# Patient Record
Sex: Male | Born: 1960 | Race: White | Hispanic: No | Marital: Single | Smoking: Former smoker
Health system: Southern US, Community
[De-identification: ages and names within clinical notes are randomized; demographics above are authoritative.]

## PROBLEM LIST (undated history)

## (undated) DIAGNOSIS — G20 Parkinson's disease: Principal | ICD-10-CM

## (undated) DIAGNOSIS — B029 Zoster without complications: Secondary | ICD-10-CM

## (undated) HISTORY — DX: Parkinson's disease: G20

## (undated) HISTORY — PX: ROTATOR CUFF REPAIR: SHX139

## (undated) HISTORY — PX: ELBOW SURGERY: SHX618

## (undated) HISTORY — DX: Zoster without complications: B02.9

---

## 2011-12-08 ENCOUNTER — Ambulatory Visit (INDEPENDENT_AMBULATORY_CARE_PROVIDER_SITE_OTHER): Payer: BC Managed Care – PPO | Admitting: Emergency Medicine

## 2011-12-08 VITALS — BP 122/64 | HR 72 | Temp 97.6°F | Resp 16 | Ht 71.0 in | Wt 210.8 lb

## 2011-12-08 DIAGNOSIS — B86 Scabies: Secondary | ICD-10-CM

## 2011-12-08 MED ORDER — PERMETHRIN 5 % EX CREA: TOPICAL_CREAM | Freq: Once | CUTANEOUS | Status: AC

## 2011-12-08 NOTE — Patient Instructions (Addendum)
Scabies Scabies are small bugs (mites) that burrow under the skin and cause red bumps and severe itching. These bugs can only be seen with a microscope. Scabies are highly contagious. They can spread easily from person to person by direct contact. They are also spread through sharing clothing or linens that have the scabies mites living in them. It is not unusual for an entire family to become infected through shared towels, clothing, or bedding.  HOME CARE INSTRUCTIONS   Your caregiver may prescribe a cream or lotion to kill the mites. If this cream is prescribed; massage the cream into the entire area of the body from the neck to the bottom of both feet. Also massage the cream into the scalp and face if your child is less than 1 year old. Avoid the eyes and mouth.   Leave the cream on for 8 to12 hours. Do not wash your hands after application. Your child should bathe or shower after the 8 to 12 hour application period. Sometimes it is helpful to apply the cream to your child at right before bedtime.   One treatment is usually effective and will eliminate approximately 95% of infestations. For severe cases, your caregiver may decide to repeat the treatment in 1 week. Everyone in your household should be treated with one application of the cream.   New rashes or burrows should not appear after successful treatment within 24 to 48 hours; however the itching and rash may last for 2 to 4 weeks after successful treatment. If your symptoms persist longer than this, see your caregiver.   Your caregiver also may prescribe a medication to help with the itching or to help the rash go away more quickly.   Scabies can live on clothing or linens for up to 3 days. Your entire child's recently used clothing, towels, stuffed toys, and bed linens should be washed in hot water and then dried in a dryer for at least 20 minutes on high heat. Items that cannot be washed should be enclosed in a plastic bag for at least 3  days.   To help relieve itching, bathe your child in a cool bath or apply cool washcloths to the affected areas.   Your child may return to school after treatment with the prescribed cream.  SEEK MEDICAL CARE IF:   The itching persists longer than 4 weeks after treatment.   The rash spreads or becomes infected (the area has red blisters or yellow-tan crust).  Document Released: 05/22/2005 Document Revised: 05/11/2011 Document Reviewed: 09/30/2008 [employer] Patient Information 2012 [employer]. 

## 2011-12-08 NOTE — Progress Notes (Deleted)
  Subjective:    Patient ID: Jesse Robinson, male    DOB: 1961/01/24, 51 y.o.   MRN: [id number]  HPI    Review of Systems     Objective:   Physical Exam        Assessment & Plan:

## 2011-12-08 NOTE — Progress Notes (Signed)
   Patient Name: Jesse Robinson Date of Birth: 04-02-1961 Medical Record Number: [id number] Gender: male Date of Encounter: 12/08/2011  Chief Complaint: Rash   History of Present Illness:  Jesse Robinson is a 51 y.o. very pleasant male patient who presents with the following:  Exposed to scabies through contact with girlfriend.  Not symptomatic   There is no problem list on file for this patient.  No past medical history on file. No past surgical history on file. History  Substance Use Topics  . Smoking status: Never Smoker   . Smokeless tobacco: Not on file  . Alcohol Use: Not on file   No family history on file. No Known Allergies  Medication list has been reviewed and updated.  Current Outpatient Prescriptions on File Prior to Visit  Medication Sig Dispense Refill  . tadalafil (CIALIS) 5 MG tablet Take 5 mg by mouth daily as needed.        Review of Systems: As per HPI, otherwise negative.    Physical Examination: Filed Vitals:   12/08/11 0810  BP: 122/64  Pulse: 72  Temp: 97.6 F (36.4 C)  Resp: 16   Filed Vitals:   12/08/11 0810  Height: 5\' 11"  (1.803 m)  Weight: 210 lb 12.8 oz (95.618 kg)   Body mass index is 29.40 kg/(m^2). Ideal Body Weight: Weight in (lb) to have BMI = 25: 178.9    GEN: WDWN, NAD, Non-toxic, Alert & Oriented x 3 HEENT: Atraumatic, Normocephalic.  Ears and Nose: No external deformity. EXTR: No clubbing/cyanosis/edema NEURO: Normal gait.  PSYCH: Normally interactive. Conversant. Not depressed or anxious appearing.  Calm demeanor.    EKG / Labs / Xrays: None available at time of encounter  Assessment and Plan: scabies  Carmelina Dane, MD

## 2011-12-18 ENCOUNTER — Ambulatory Visit: Payer: BC Managed Care – PPO

## 2012-06-05 ENCOUNTER — Ambulatory Visit (INDEPENDENT_AMBULATORY_CARE_PROVIDER_SITE_OTHER): Payer: BC Managed Care – PPO | Admitting: Internal Medicine

## 2012-06-05 VITALS — BP 118/82 | HR 80 | Temp 97.6°F | Resp 18 | Wt 213.0 lb

## 2012-06-05 DIAGNOSIS — B029 Zoster without complications: Secondary | ICD-10-CM

## 2012-06-05 MED ORDER — VALACYCLOVIR HCL 1 G PO TABS: 1000.0000 mg | ORAL_TABLET | Freq: Two times a day (BID) | ORAL | Status: DC

## 2012-06-05 NOTE — Progress Notes (Signed)
  Subjective:    Patient ID: Jesse Robinson, male    DOB: February 11, 1961, 52 y.o.   MRN: [id number]  HPI  Patient with left thigh/groin rash for 5 days/hypersensitive to touch No fever or chills Review of Systems     Objective:   Physical Exam  Erythematous papulovesicular lesions in groups around left groin and left lateral hip  with sensitivity even in the normal skin areas of that dermatome     Assessment & Plan:  Problem #1 shingles Meds ordered this encounter  Medications  . valACYclovir (VALTREX) 1000 MG tablet    Sig: Take 1 tablet (1,000 mg total) by mouth 2 (two) times daily.    Dispense:  20 tablet    Refill:  0   Advised will still need to consider the shingles vaccine after recovery.  Follow if not well in 2-3 weeks or if increased pain

## 2012-06-05 NOTE — Patient Instructions (Addendum)

## 2012-10-21 ENCOUNTER — Encounter: Payer: Self-pay | Admitting: Neurology

## 2012-10-21 ENCOUNTER — Telehealth: Payer: Self-pay | Admitting: Neurology

## 2012-10-21 ENCOUNTER — Ambulatory Visit (INDEPENDENT_AMBULATORY_CARE_PROVIDER_SITE_OTHER): Payer: BC Managed Care – PPO | Admitting: Neurology

## 2012-10-21 VITALS — BP 134/84 | HR 76 | Temp 97.8°F | Ht 71.0 in | Wt 216.0 lb

## 2012-10-21 DIAGNOSIS — G20 Parkinson's disease: Secondary | ICD-10-CM

## 2012-10-21 MED ORDER — RASAGILINE MESYLATE 1 MG PO TABS: 1.0000 mg | ORAL_TABLET | Freq: Every day | ORAL | Status: DC

## 2012-10-21 MED ORDER — RASAGILINE MESYLATE 0.5 MG PO TABS: 0.5000 mg | ORAL_TABLET | Freq: Every day | ORAL | Status: DC

## 2012-10-21 NOTE — Telephone Encounter (Signed)
Patient was taking 0.5mg  daily for 14 days, then was asked to increase dose to 1mg  daily.  I will send new Rx for 1mg , as the insurance will not pay for two 0.5mg  tablets.

## 2012-10-21 NOTE — Patient Instructions (Addendum)
I do have some generic suggestions for you today:   Please make sure that you drink plenty of fluids. I would like for you to exercise daily for example in the form of walking 20-30 minutes every day, if you can. Please keep a regular sleep-wake schedule, keep regular meal times, do not skip any meals, eat  healthy snacks in between meals, such as fruit or nuts. Try to eat protein with every meal.   As far as your medications are concerned, I would like to suggest: trial of Azilect. Take one pill once daily in the morning for 2 weeks, then two pills once daily in the morning thereafter.     As far as diagnostic testing, I recommend: MRI brain.  Engage in social activities in your community and with your family and try to keep up with current events by reading the newspaper or watching the news.  Please also call us for any test results so we can go over those with you on the phone. Brett Canales is my clinical assistant and will answer any of your questions and relay your messages to me and will give you my messages.   Our phone number is [phone]. We also have an after hours call service for urgent matters and there is a physician on-call for urgent questions. For any emergencies you know to call 911 or go to the nearest emergency room.  Follow up in 3 months.

## 2012-10-21 NOTE — Progress Notes (Signed)
Subjective:    Patient ID: Savino Whisenant is a 52 y.o. male.  HPI  Huston Foley, MD, PhD [facility] [address] [address] [place], [place] [address]  Dear Shanda Bumps,  I saw your patient, Elias Dennington, upon your kind request in my neurologic clinic today for initial consultation of his tremor. The patient is accompanied by his Donnald Garre, today. As you know, Mr. Kakar is a very pleasant 52 year old right-handed gentleman with an underlying medical history of erectile dysfunction who has started noticing a L hand tremor some 8-9 months ago, which was intermittent in the beginning and affected only digits 1 and 2, and some 3-4 months ago it has become more pronounced and has been affecting his entire L hand. He broke his L wrist twice at least, but this was several years ago. He had shingles in the L inner leg some 5 months ago and it was treated with Valtrex and he has had not any sequelae. He has not notice any gait or balance changes, no memory changes or mood disturbance. Never had TIA or stroke symptoms, denying sudden onset of one sided weakness, numbness, tingling, slurring of speech or droopy face, hearing loss, tinnitus, diplopia or visual field cut or monocular loss of vision, and denies recurrent headaches.  He has noted a little bit of tightness in the L leg when driving. He is a Hydrographic surveyor.   He is status post rotator cuff surgery on the R some 14 years ago.  His current medications are Cialis.   His Past Medical History Is Significant For: Past Medical History  Diagnosis Date  . Shingles     His Past Surgical History Is Significant For: Past Surgical History  Procedure Laterality Date  . Elbow surgery Left   . Rotator cuff repair Right     His Family History Is Significant For: History reviewed. No pertinent family history.  His Social History Is Significant For: History   Social History  . Marital Status: Single   Spouse Name: N/A    Number of Children: N/A  . Years of Education: N/A   Social History Main Topics  . Smoking status: Never Smoker   . Smokeless tobacco: None  . Alcohol Use: Yes  . Drug Use: No  . Sexually Active: Yes   Other Topics Concern  . None   Social History Narrative  . None    His Allergies Are:  No Known Allergies:   His Current Medications Are:  Outpatient Encounter Prescriptions as of 10/21/2012  Medication Sig Dispense Refill  . tadalafil (CIALIS) 5 MG tablet Take 5 mg by mouth daily as needed.      . rasagiline (AZILECT) 0.5 MG TABS Take 1 tablet (0.5 mg total) by mouth daily. Take 1 by mouth daily in the morning for 2 weeks, then 2 by mouth daily thereafter  60 tablet  3  . [DISCONTINUED] valACYclovir (VALTREX) 1000 MG tablet Take 1 tablet (1,000 mg total) by mouth 2 (two) times daily.  20 tablet  0   No facility-administered encounter medications on file as of 10/21/2012.  : Review of Systems  Neurological: Positive for tremors.    Objective:  Neurologic Exam  Physical Exam Physical Examination:   Filed Vitals:   10/21/12 0852  BP: 134/84  Pulse: 76  Temp: 97.8 F (36.6 C)    General Examination: The patient is a very pleasant 52 y.o. male in no acute distress.  HEENT: Normocephalic, atraumatic, pupils  are equal, round and reactive to light and accommodation. Funduscopic exam is normal with sharp disc margins noted. Extraocular tracking shows no saccadic breakdown without nystagmus noted. There is no limitation to his gaze. There is mild decrease in eye blink rate. Hearing is intact. Tympanic membranes are clear bilaterally. Face is symmetric with mild facial masking and normal facial sensation. There is no lip, neck or jaw tremor. Neck is mildly rigid with intact passive ROM. There are no carotid bruits on auscultation. Oropharynx exam reveals mild mouth dryness. No significant airway crowding is noted. Mallampati is class II. Tongue protrudes  centrally and palate elevates symmetrically.   There is no drooling.   Chest: is clear to auscultation without wheezing, rhonchi or crackles noted.  Heart: sounds are regular and normal without murmurs, rubs or gallops noted.   Abdomen: is soft, non-tender and non-distended with normal bowel sounds appreciated on auscultation.  Extremities: There is no pitting edema in the distal lower extremities bilaterally. Pedal pulses are intact. There are no varicose veins.  Skin: is warm and dry with no trophic changes noted.   Musculoskeletal: exam reveals no obvious joint deformities, tenderness, joint swelling or erythema.  Neurologically:  Mental status: The patient is awake and alert, paying good  attention. He is able to completely provide the history. His fiance provides details. He is oriented to: person, place, time/date, situation, day of week, month of year and year. His memory, attention, language and knowledge are intact. There is no aphasia, agnosia, apraxia or anomia. There is a no degree of bradyphrenia. Speech is mildly hypophonic with no dysarthria noted. Mood is congruent and affect is normal.   Cranial nerves are as described above under HEENT exam. In addition, shoulder shrug is normal with equal shoulder height noted.  Motor exam: Normal bulk, and strength for age is noted. There are no dyskinesias noted.    Tone is mildly rigid on the L, with presence of cogwheeling in the left upper extremity. There is overall no significant bradykinesia. There is no drift or rebound.  There is a moderate resting tremor in the right upper extremity and no resting tremor elsewhere. The tremor is fairly constant.  Romberg is negative.  Reflexes are 2+ in the upper extremities and 2+ in the lower extremities.  Fine motor skills exam: Finger taps are normal on the right and mildly impaired on the left. Hand movements are normal on the right and mildly impaired on the left. RAP (rapid alternating  patting) is normal on the right and normal on the left. Foot taps are minimally impaired on the right and mildly impaired on the left. Foot agility (in the form of heel stomping) is not impaired on the right and mildly impaired on the left.    Cerebellar testing shows no dysmetria or intention tremor on finger to nose testing. Heel to shin is unremarkable bilaterally. There is no truncal or gait ataxia.   Sensory exam is intact to light touch, pinprick, vibration, temperature sense and proprioception in the upper and lower extremities.   Gait, station and balance: He stands up from the seated position with no signficant difficulty and does not need to push up with His hands. He needs no assistance. No veering to one side is noted. He is not noted to lean to the side. Posture is minimally stooped. Stance is narrow-based. He walks with normal stride length and pace and decreased arm swing on the left. He turns in en bloc. Tandem walk is possible.  Balance is not impaired. He is able to do a toe or heel stance.        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 I had a long chat with the patient and Toniann Fail about my findings and the diagnosis of parkinsonism/Parksinson's disease, its prognosis and treatment options. We talked about medical treatments and non-pharmacological approaches. We talked about maintaining a healthy lifestyle in general. I encouraged the patient to eat healthy, exercise daily and keep well hydrated, to keep a scheduled bedtime and wake time routine, to not skip any meals and eat healthy snacks in between meals and to have protein with every meal. I stressed the importance of regular exercise, weight and of course the patient's own mobility limitations.   As far as further diagnostic testing is  concerned, I suggested: I would like to go ahead and do a brain MRI without contrast. As far as medications are concerned, I recommended the following at this time: A trial of rasagiline, starting at 0.5 mg once daily for 2 weeks and then increasing it to 1 mg once daily thereafter. I talked to him about potential side effects of this medication.  I answered all their questions today and the patient and his girlfriend were in agreement with the above outlined plan. I would like to see the patient back in 3 months, sooner if the need arises and encouraged them to call with any interim questions, concerns, problems or updates and refill requests and/or test results.   Thank you very much for allowing me to participate in the care of this nice patient. If I can be of any further assistance to you please do not hesitate to call me at [phone].  Sincerely,   Huston Foley, MD, PhD

## 2012-10-31 ENCOUNTER — Ambulatory Visit (INDEPENDENT_AMBULATORY_CARE_PROVIDER_SITE_OTHER): Payer: BC Managed Care – PPO

## 2012-10-31 DIAGNOSIS — G20C Parkinsonism, unspecified: Secondary | ICD-10-CM

## 2012-10-31 DIAGNOSIS — G20 Parkinson's disease: Secondary | ICD-10-CM

## 2012-11-04 NOTE — Progress Notes (Signed)
Quick Note:  Please call and advise the patient that the recent scan we did showed no acute findings, such as a stroke, or mass or blood products. There was mild volume loss which we call atrophy. There were changes in the deeper structures of the brain, which we call white matter changes or microvascular changes. These are tiny white spots, that occur with time and are seen in a variety of conditions, including with normal aging, chronic hypertension, chronic headaches, chronic diabetes, chronic hyperlipidemia. Again, there were no acute findings, such as a stroke, or mass or blood products. No further action is required on this test at this time, other than re-enforcing the importance of good blood pressure control, good cholesterol control, good blood sugar control, and weight management. Please remind patient to keep any upcoming appointments or tests and to call us with any interim questions, concerns, problems or updates. Thanks,  Huston Foley, MD, PhD  ______

## 2012-11-05 ENCOUNTER — Telehealth: Payer: Self-pay | Admitting: Neurology

## 2012-11-05 NOTE — Telephone Encounter (Signed)
Patient's significant other Toniann Fail) is calling to ask about the patient's Azilect - the prescription was called in to the patient's pharmacy but only x2 weeks of medication were ordered.  Please call either the patient or significant other.

## 2012-11-05 NOTE — Telephone Encounter (Signed)
Rx was sent to the pharmacy for 30 days plus 3 refills.  Unsure why patient only got 2 weeks worth of meds.  I called the pharmacy. Spoke with Morrie Sheldon.  She said they do have the 30 day Rx on patients file, but they never came in to pick anything up.  I called the patient back, got no answer.  Called Toniann Fail, got no answer.  Left messages.

## 2012-11-07 NOTE — Progress Notes (Signed)
Quick Note:  I spoke to patient and relayed MRI results, per Dr. Frances Furbish. Patient expressed understanding. ______

## 2012-11-28 ENCOUNTER — Other Ambulatory Visit: Payer: Self-pay | Admitting: Neurology

## 2013-01-27 ENCOUNTER — Encounter: Payer: Self-pay | Admitting: Neurology

## 2013-01-27 ENCOUNTER — Ambulatory Visit (INDEPENDENT_AMBULATORY_CARE_PROVIDER_SITE_OTHER): Payer: BC Managed Care – PPO | Admitting: Neurology

## 2013-01-27 VITALS — BP 122/84 | HR 72 | Temp 97.4°F | Ht 69.5 in | Wt 216.5 lb

## 2013-01-27 DIAGNOSIS — G20 Parkinson's disease: Secondary | ICD-10-CM

## 2013-01-27 DIAGNOSIS — G20A1 Parkinson's disease without dyskinesia, without mention of fluctuations: Secondary | ICD-10-CM

## 2013-01-27 HISTORY — DX: Parkinson's disease: G20

## 2013-01-27 HISTORY — DX: Parkinson's disease without dyskinesia, without mention of fluctuations: G20.A1

## 2013-01-27 NOTE — Progress Notes (Signed)
Subjective:    Patient ID: Jesse Robinson is a 52 y.o. male.  HPI  Interim history:   Mr. 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 The patient is accompanied by his girlfriend, Toniann Fail, again today. I first met him on 10/21/2012 at which time he presented with a left hand tremor of 8-9 months duration, progressive. He had shingles in the left and her leg some 8 months ago which was treated with Valtrex without any obvious sequelae. He denied any gait or balance problems, memory problems, mood problems. He is a Hydrographic surveyor and had noted a little bit of tightness in his left leg when driving. At the time of his first visit with me in May I suggested a brain MRI without contrast and a trial of rasagiline starting at 0.5 mg once daily for 2 weeks and then increasing it to 1 mg once daily thereafter. He had a brain MRI on 10/31/12: Mildly abnormal MRI scan of the brain showing mild changes of chronic microvascular ischemia and generalized cerebral atrophy.  He has had nearly daily HAs, which are described as moderately severe and in the back of his head, since starting the Azilect and he nor his Fiance have noted any improvement in his Sx in the last 3 months, since starting the new medication. He has had some issues with mood irritability recently. He has had some issues with achieving an orgasm, which is also new per fiance. He takes Cialis for ED.  His Past Medical History Is Significant For: Past Medical History  Diagnosis Date  . Shingles     His Past Surgical History Is Significant For: Past Surgical History  Procedure Laterality Date  . Elbow surgery Left   . Rotator cuff repair Right     His Family History Is Significant For: History reviewed. No pertinent family history.  His Social History Is Significant For: History   Social History  .  Marital Status: Single    Spouse Name: N/A    Number of Children: 3  . Years of Education: N/A   Occupational History  .  [employer]   Social History Main Topics  . Smoking status: Former Games developer  . Smokeless tobacco: Never Used  . Alcohol Use: Yes     Comment: beer occasionally  . Drug Use: No  . Sexual Activity: Yes   Other Topics Concern  . None   Social History Narrative   Patient lives at home with family.   Caffeine Use: 3 cups daily    His Allergies Are:  No Known Allergies:   His Current Medications Are:  Outpatient Encounter Prescriptions as of 01/27/2013  Medication Sig Dispense Refill  . rasagiline (AZILECT) 1 MG TABS Take 1 tablet (1 mg total) by mouth daily. After finishing the 0.5mg  14 day starter dose  30 tablet  3  . tadalafil (CIALIS) 5 MG tablet Take 5 mg by mouth daily as needed.       No facility-administered encounter medications on file as of 01/27/2013.  :  Review of Systems  Constitutional: Positive for unexpected weight change (weight gain).  Psychiatric/Behavioral: Positive for sleep disturbance (not enough sleep, insomnia, shift work).    Objective:  Neurologic Exam  Physical Exam Physical Examination:   Filed Vitals:   01/27/13 1213  BP: 122/84  Pulse: 72  Temp: 97.4 F (36.3 C)    General Examination: The patient  is a very pleasant 52 y.o. male in no acute distress.  HEENT: Normocephalic, atraumatic, pupils are equal, round and reactive to light and accommodation. Extraocular tracking shows no saccadic breakdown without nystagmus noted. There is no limitation to his gaze. There is mild decrease in eye blink rate. Hearing is intact. Tympanic membranes are clear bilaterally. Face is symmetric with mild facial masking and normal facial sensation. There is no lip, neck or jaw tremor. Neck is mildly rigid with intact passive ROM. There are no carotid bruits on auscultation. Oropharynx exam reveals mild mouth dryness. No  significant airway crowding is noted. Mallampati is class II. Tongue protrudes centrally and palate elevates symmetrically.   There is no drooling.   Chest: is clear to auscultation without wheezing, rhonchi or crackles noted.  Heart: sounds are regular and normal without murmurs, rubs or gallops noted.   Abdomen: is soft, non-tender and non-distended with normal bowel sounds appreciated on auscultation.  Extremities: There is no pitting edema in the distal lower extremities bilaterally. Pedal pulses are intact. There are no varicose veins.  Skin: is warm and dry with no trophic changes noted.   Musculoskeletal: exam reveals no obvious joint deformities, tenderness, joint swelling or erythema.  Neurologically:  Mental status: The patient is awake and alert, paying good  attention. He is able to completely provide the history. His fiance provides details. He is oriented to: person, place, time/date, situation, day of week, month of year and year. His memory, attention, language and knowledge are intact. There is no aphasia, agnosia, apraxia or anomia. There is a no degree of bradyphrenia. Speech is mildly hypophonic with no dysarthria noted. Mood is congruent and affect is normal.   Cranial nerves are as described above under HEENT exam. In addition, shoulder shrug is normal with equal shoulder height noted.  Motor exam: Normal bulk, and strength for age is noted. There are no dyskinesias noted.    Tone is mildly rigid on the L, with presence of cogwheeling in the left upper extremity. There is overall no significant bradykinesia. There is no drift or rebound.  There is a moderate resting tremor in the LUE upper extremity and no resting tremor elsewhere. The tremor is fairly constant.  Romberg is negative.  Reflexes are 2+ in the upper extremities and 2+ in the lower extremities.  Fine motor skills exam: Finger taps are mildly impaired on the R and mild to moderately impaired on the left.  Hand movements are normal on the right and mildly impaired on the left. RAP (rapid alternating patting) is normal on the right and normal to minimally impaired on the left. Foot taps are minimally impaired on the right and mildly impaired on the left. Foot agility (in the form of heel stomping) is not impaired on the right and mildly impaired on the left.    Cerebellar testing shows no dysmetria or intention tremor on finger to nose testing. Heel to shin is unremarkable bilaterally. There is no truncal or gait ataxia.   Sensory exam is intact to light touch, pinprick, vibration, temperature sense and proprioception in the upper and lower extremities.   Gait, station and balance: He stands up from the seated position with no signficant difficulty and does not need to push up with His hands. He needs no assistance. No veering to one side is noted. He is not noted to lean to the side. Posture is minimally stooped for age. Stance is narrow-based. He walks with normal stride length and pace and  decreased arm swing on the left. He turns in en bloc. Tandem walk is possible. Balance is not impaired. He is able to do a toe or heel stance.        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 I had a long chat with the patient and Toniann Fail about my findings and the diagnosis of Parksinson's disease, its prognosis and treatment options. He has tried Azilect without benefit and with SE reported. His fiance is requesting a referral to get consultation at [facility] and if possible to get plugged in with the Movement D/O group there; I will be happy to arrange that. At this juncture, I have asked him to taper off rasagiline. He will take half a pill once daily for one week then stop it. We talked about dopamine agonists quite a bit today. We also brushed upon the  DBS surgery option. At this juncture I suggested that we could try him on a dopamine agonist to start him off. I suggested neupro patch, and I gave him samples of 1 mg patch as well as the 2 mg patch. He is going to take the first strength for one week then 2 mg strength for one week and also provided them with a 30 day free prescription trial and if tolerated we can go up to 4 mg strength once daily. I've asked him to call his back in a couple weeks to give a status report. We're looking to hopefully see a decrease in his headaches and then we should also be able to find out when his appointment [facility]. I suggested a three-month appointment with me in the interim. Depending on how his appointment at [facility] goes he can continue follow there as well. I talked to him about potential side effects with a dopamine agonists including impulse control disorders. They demonstrated understanding and agreement with the plan. I've encouraged him to call with any interim questions, concerns or problems are updates.

## 2013-01-27 NOTE — Patient Instructions (Addendum)
We will taper your off of Azilect: take 1/2 pill daily for 7 days, then stop.  We will refer you to [facility] to get plugged in with the Movement Disorders department.  Please call us in 2 weeks, to give a status report: How are Headaches off Azilect, and when is your appt at [facility]. We could try you a dopamine agonist, such as Neupro.  Call us for a free 30 day offer for the 4 mg patch for Neupro.

## 2013-02-17 ENCOUNTER — Telehealth: Payer: Self-pay

## 2013-02-17 MED ORDER — ROTIGOTINE 4 MG/24HR TD PT24: 1.0000 | MEDICATED_PATCH | Freq: Every day | TRANSDERMAL | Status: DC

## 2013-02-17 NOTE — Telephone Encounter (Signed)
Patient called stating he would like to get a Rx for Neupro 4mg  sent to [facility].   Last OV note says: I suggested neupro patch, and I gave him samples of 1 mg patch as well as the 2 mg patch. He is going to take the first strength for one week then 2 mg strength for one week and also provided them with a 30 day free prescription trial and if tolerated we can go up to 4 mg strength once daily.

## 2013-03-03 ENCOUNTER — Emergency Department (HOSPITAL_COMMUNITY)
Admission: EM | Admit: 2013-03-03 | Discharge: 2013-03-03 | Disposition: A | Discharge status: Home / Self Care | Payer: BC Managed Care – PPO | Attending: Emergency Medicine | Admitting: Emergency Medicine

## 2013-03-03 ENCOUNTER — Encounter (HOSPITAL_COMMUNITY): Payer: Self-pay | Admitting: Emergency Medicine

## 2013-03-03 ENCOUNTER — Telehealth: Payer: Self-pay | Admitting: Neurology

## 2013-03-03 DIAGNOSIS — T44905A Adverse effect of unspecified drugs primarily affecting the autonomic nervous system, initial encounter: Secondary | ICD-10-CM | POA: Insufficient documentation

## 2013-03-03 DIAGNOSIS — Z8619 Personal history of other infectious and parasitic diseases: Secondary | ICD-10-CM | POA: Insufficient documentation

## 2013-03-03 DIAGNOSIS — R55 Syncope and collapse: Secondary | ICD-10-CM | POA: Insufficient documentation

## 2013-03-03 DIAGNOSIS — Z79899 Other long term (current) drug therapy: Secondary | ICD-10-CM | POA: Insufficient documentation

## 2013-03-03 DIAGNOSIS — G20 Parkinson's disease: Secondary | ICD-10-CM | POA: Insufficient documentation

## 2013-03-03 DIAGNOSIS — G20A1 Parkinson's disease without dyskinesia, without mention of fluctuations: Secondary | ICD-10-CM | POA: Insufficient documentation

## 2013-03-03 DIAGNOSIS — Z87891 Personal history of nicotine dependence: Secondary | ICD-10-CM | POA: Insufficient documentation

## 2013-03-03 DIAGNOSIS — R110 Nausea: Secondary | ICD-10-CM | POA: Insufficient documentation

## 2013-03-03 DIAGNOSIS — R61 Generalized hyperhidrosis: Secondary | ICD-10-CM | POA: Insufficient documentation

## 2013-03-03 DIAGNOSIS — T50905A Adverse effect of unspecified drugs, medicaments and biological substances, initial encounter: Secondary | ICD-10-CM

## 2013-03-03 LAB — COMPREHENSIVE METABOLIC PANEL
ALT: 19 U/L (ref 0–53)
Albumin: 3.4 g/dL — ABNORMAL LOW (ref 3.5–5.2)
Alkaline Phosphatase: 53 U/L (ref 39–117)
CO2: 27 mEq/L (ref 19–32)
GFR calc Af Amer: >90 mL/min (ref >90)
GFR calc non Af Amer: >90 mL/min (ref >90)
Glucose, Bld: 198 mg/dL — ABNORMAL HIGH (ref 70–99)
Potassium: 3.8 mEq/L (ref 3.5–5.1)
Sodium: 138 mEq/L (ref 135–145)
Total Protein: 6.2 g/dL (ref 6.0–8.3)

## 2013-03-03 LAB — URINALYSIS, ROUTINE W REFLEX MICROSCOPIC
Bilirubin Urine: NEGATIVE
Leukocytes, UA: NEGATIVE
Nitrite: NEGATIVE
Protein, ur: NEGATIVE mg/dL
Specific Gravity, Urine: 1.012 (ref 1.005–1.030)
pH: 7 (ref 5.0–8.0)

## 2013-03-03 LAB — CBC
Hemoglobin: 13.2 g/dL (ref 13.0–17.0)
MCHC: 34.8 g/dL (ref 30.0–36.0)
RBC: 4.36 MIL/uL (ref 4.22–5.81)

## 2013-03-03 LAB — POCT I-STAT, CHEM 8
Calcium, Ion: 1.24 mmol/L — ABNORMAL HIGH (ref 1.12–1.23)
Chloride: 103 mEq/L (ref 96–112)
Glucose, Bld: 196 mg/dL — ABNORMAL HIGH (ref 70–99)
HCT: 38 % — ABNORMAL LOW (ref 39.0–52.0)
Hemoglobin: 12.9 g/dL — ABNORMAL LOW (ref 13.0–17.0)

## 2013-03-03 MED ORDER — ROTIGOTINE 2 MG/24HR TD PT24: 1.0000 | MEDICATED_PATCH | Freq: Every day | TRANSDERMAL | Status: DC

## 2013-03-03 MED ORDER — SODIUM CHLORIDE 0.9 % IV BOLUS (SEPSIS): 1000.0000 mL | Freq: Once | INTRAVENOUS | Status: AC

## 2013-03-03 NOTE — ED Notes (Signed)
EKG given to MD

## 2013-03-03 NOTE — ED Notes (Signed)
Per EMS- patient and wife called EMS for patient feeling like he had low blood pressure accompanied with nausea, sweating and dizziness.

## 2013-03-03 NOTE — ED Provider Notes (Signed)
CSN: [id number]     Arrival date & time 03/03/13  0434 History   First MD Initiated Contact with Patient 03/03/13 0503     Chief Complaint  Patient presents with  . Hypotension   (Consider location/radiation/quality/duration/timing/severity/associated sxs/prior Treatment) HPI History provided by patient and his wife. Has history of Parkinson's disease and recently was started on new medication by his neurologist, medication is Neupro. The last week he has increased his dose to 4 mg. He gets a headache shortly after placing patch and the headache goes away. He has not noticed any other symptoms until tonight. About an hour prior to arrival, was at home sleeping on the couch and woke with severe nausea. He went to the bathroom and felt near syncopal so he layed down on the floor. He became diaphoretic. Eventually started to somewhat better as he walked to the bedroom his wife noticed he was very pale and did not look right. EMS was called and patient was found to have a blood pressure of 70 systolic. He was given IV fluids and brought in by EMS. Now in the emergency department feels significantly better without any nausea or symptoms. He denies any chest pain, shortness of breath, unilateral weakness or numbness. No history of same. No emesis. No diarrhea. No abdominal pain. No fevers or chills. No recent illness.  Past Medical History  Diagnosis Date  . Shingles   . Parkinson's disease 01/27/2013   Past Surgical History  Procedure Laterality Date  . Elbow surgery Left   . Rotator cuff repair Right    History reviewed. No pertinent family history. History  Substance Use Topics  . Smoking status: Former Games developer  . Smokeless tobacco: Never Used  . Alcohol Use: Yes     Comment: beer occasionally    Review of Systems  Constitutional: Negative for fever and chills.  HENT: Negative for neck pain and neck stiffness.   Eyes: Negative for pain.  Respiratory: Negative for shortness of breath.    Cardiovascular: Negative for chest pain.  Gastrointestinal: Negative for vomiting and abdominal pain.  Genitourinary: Negative for dysuria.  Musculoskeletal: Negative for back pain.  Skin: Negative for rash.  Neurological: Negative for seizures, syncope and headaches.  All other systems reviewed and are negative.    Allergies  Review of patient's allergies indicates no known allergies.  Home Medications   Current Outpatient Rx  Name  Route  Sig  Dispense  Refill  . rotigotine (NEUPRO) 4 MG/24HR   Transdermal   Place 1 patch onto the skin daily.   30 patch   3   . tadalafil (CIALIS) 5 MG tablet   Oral   Take 5 mg by mouth daily as needed.          BP 104/62  Pulse 74  SpO2 97% Physical Exam  Constitutional: He is oriented to person, place, and time. He appears well-developed and well-nourished.  HENT:  Head: Normocephalic and atraumatic.  Mouth/Throat: Oropharynx is clear and moist.  Eyes: EOM are normal. Pupils are equal, round, and reactive to light.  Neck: Neck supple.  Cardiovascular: Normal rate, regular rhythm and intact distal pulses.   Pulmonary/Chest: Effort normal and breath sounds normal. No respiratory distress. He exhibits no tenderness.  Abdominal: He exhibits no distension. There is no tenderness.  Musculoskeletal: Normal range of motion. He exhibits no edema.  Neurological: He is alert and oriented to person, place, and time. No cranial nerve deficit. Coordination normal.  Mild upper extremity tremor. No  neuro deficits otherwise  Skin: Skin is warm and dry.    ED Course  Procedures (including critical care time) Labs Review Labs Reviewed  URINALYSIS, ROUTINE W REFLEX MICROSCOPIC - Abnormal; Notable for the following:    APPearance HAZY (*)    All other components within normal limits  CBC - Abnormal; Notable for the following:    HCT 37.9 (*)    All other components within normal limits  COMPREHENSIVE METABOLIC PANEL - Abnormal; Notable for  the following:    Glucose, Bld 198 (*)    Albumin 3.4 (*)    Total Bilirubin 0.2 (*)    All other components within normal limits  POCT I-STAT, CHEM 8 - Abnormal; Notable for the following:    Glucose, Bld 196 (*)    Calcium, Ion 1.24 (*)    Hemoglobin 12.9 (*)    HCT 38.0 (*)    All other components within normal limits  LACTIC ACID, PLASMA  POCT I-STAT TROPONIN I    \  Date: 03/03/2013  Rate: 70  Rhythm: normal sinus rhythm  QRS Axis: normal  Intervals: PR shortened  ST/T Wave abnormalities: nonspecific ST/T changes  Conduction Disutrbances:nonspecific intraventricular conduction delay  Narrative Interpretation:   Old EKG Reviewed: none available  Continued IV fluids.  Medications reviewed, Neupro can cause hypotension and syncope, is also recommended to avoid abrupt withdrawal. Neurology consulted for recommendations.   6:26 AM discussed with neurology on call Dr.Camilo. He recommends going back to initial dose of Neupro and close follow up with his neurologist.    6:59 AM on recheck, patient remains asymptomatic in the emergency department. Patient agrees that medications cause of his symptoms tonight. He agrees with plan go back to original 2 mg patch. He agrees to call his neurologist today for followup and further recommendations. He agrees to strict return precautions.  MDM   Diagnosis: Adverse drug reaction, near syncope   IV fluids. Serial dilations, improved condition  Labs obtained and reviewed as above. Screening EKG. Vital Signs and nursing notes reviewed and considered Neurology consulted   Sunnie Nielsen, MD 03/03/13 [phone]

## 2013-03-03 NOTE — ED Notes (Signed)
Family at bedside. 

## 2013-03-03 NOTE — ED Notes (Signed)
Pt BP 110/65. States he has been on an increased dose of a medication patch for Parkinson's, since increasing to 4mg  a week ago, he has noticed headache with nausea. Pt states he put on the patch tonight and went to bed, woke up and felt dizzy, clammy, and felt like his blood pressure was really low. Patch is currently off patient. Pt states every time he puts on a new patch he gets a headache.

## 2013-03-04 NOTE — Telephone Encounter (Signed)
Spoke w/Jesse Robinson and relayed Dr Teofilo Pod instructions regarding the Neupro patch Rx.  The Jesse Robinson understood and stated that he would report back next week as requested.

## 2013-03-04 NOTE — Telephone Encounter (Signed)
I called pt and he relayed that he is now on neupro patch 2mg  since his ED visit.   He stated that he started taking the neupro 1mg  for one week , then 2mg  for following week and then 4mg  patch (took this for 1-1/2 wks).   Was having headache, nausea, then episode for which he went to ED.  He is now on neupro patch 2mg  and has L hand tremor, along with fatigue.  Please advise.  Thanks

## 2013-03-04 NOTE — Telephone Encounter (Signed)
Please call patient and asked him to take Neupro patch 2 mg strength every other day for the rest of this week and then stop it altogether. Please have him report back next week and we can think about an alternative medication.

## 2013-03-07 ENCOUNTER — Telehealth: Payer: Self-pay | Admitting: Neurology

## 2013-03-10 NOTE — Telephone Encounter (Signed)
I have called patient to get more information left voice asking him to return my phone call.

## 2013-03-11 ENCOUNTER — Telehealth: Payer: Self-pay | Admitting: Neurology

## 2013-03-12 ENCOUNTER — Telehealth: Payer: Self-pay | Admitting: Neurology

## 2013-03-13 NOTE — Telephone Encounter (Signed)
Duplicate message.      Closing encounter.

## 2013-03-13 NOTE — Telephone Encounter (Signed)
Spoke to patient. Scheduled f/u appt w/ NP-Lam per Dr. Frances Furbish. Patient agreed.

## 2013-03-17 ENCOUNTER — Ambulatory Visit (INDEPENDENT_AMBULATORY_CARE_PROVIDER_SITE_OTHER): Payer: BC Managed Care – PPO | Admitting: Nurse Practitioner

## 2013-03-17 ENCOUNTER — Encounter: Payer: Self-pay | Admitting: Nurse Practitioner

## 2013-03-17 ENCOUNTER — Encounter (INDEPENDENT_AMBULATORY_CARE_PROVIDER_SITE_OTHER): Payer: Self-pay

## 2013-03-17 VITALS — BP 131/87 | HR 88 | Ht 69.5 in | Wt 217.0 lb

## 2013-03-17 DIAGNOSIS — G20A1 Parkinson's disease without dyskinesia, without mention of fluctuations: Secondary | ICD-10-CM

## 2013-03-17 DIAGNOSIS — G20 Parkinson's disease: Secondary | ICD-10-CM

## 2013-03-17 MED ORDER — TRIHEXYPHENIDYL HCL 2 MG PO TABS: ORAL_TABLET | ORAL | Status: DC

## 2013-03-17 NOTE — Patient Instructions (Addendum)
I had a long chat with the patient about treatment options. He has tried Azilect without benefit and with SE reported. He did not tolerate the Neupro patch.  We will try Artane 2mg  , 1/2 a pill at night for 1 week, then 1/2 pill in the morning and a whole pill at night for 1 week, then 1 whole tablet both day and night.  I talked to him about potential side effects, which are dry mouth, possible urinary retention, constipation, blurry vision,possibly drowsiness. He demonstrated understanding and agreement with the plan. I've encouraged him to call with any interim questions, concerns or problems are updates.  Keep next follow up appointment.

## 2013-03-17 NOTE — Progress Notes (Addendum)
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 The patient is accompanied by his girlfriend, Jesse Robinson, again today. I first met him on 10/21/2012 at which time he presented with a left hand tremor of 8-9 months duration, progressive. He had shingles in the left and her leg some 8 months ago which was treated with Valtrex without any obvious sequelae. He denied any gait or balance problems, memory problems, mood problems. He is a Hydrographic surveyor and had noted a little bit of tightness in his left leg when driving. At the time of his first visit with me in May I suggested a brain MRI without contrast and a trial of rasagiline starting at 0.5 mg once daily for 2 weeks and then increasing it to 1 mg once daily thereafter. He had a brain MRI on 10/31/12: Mildly abnormal MRI scan of the brain showing mild changes of chronic microvascular ischemia and generalized cerebral atrophy.  He has had nearly daily HAs, which are described as moderately severe and in the back of his head, since starting the Azilect and he nor his Fiance have noted any improvement in his Sx in the last 3 months, since starting the new medication. He has had some issues with mood irritability recently. He has had some issues with achieving an orgasm, which is also new per fiance. He takes Cialis for ED.   UPDATE 03/17/13 (LL):  Mr. Mabus comes in for sooner follow up visit after episode of hypotension and syncope which sent him to the hospital on 03/03/13.  BP in the ER was 102/64. He has been on the 4 mg strength of Neupro for less than 2 weeks.  ER MD gave him an Rx for 2 mg patches which he used for a few days, then went to every other day, and then  stopped.  He has been off of the patches for about 2 weeks he states.  While on the patches he had nausea and headaches every day.  He did not tolerate Azilect in the past either.  He is a long-distance Naval architect and must not take anything that may potentially make him sleepy.  ALLERGIES: No Known Allergies  HOME MEDICATIONS: Outpatient Prescriptions Prior to Visit  Medication Sig Dispense Refill  . tadalafil (CIALIS) 5 MG tablet Take 5 mg by mouth daily as needed.        PAST MEDICAL HISTORY: Past Medical History  Diagnosis Date  . Shingles   . Parkinson's disease 01/27/2013    PAST SURGICAL HISTORY: Past Surgical History  Procedure Laterality Date  . Elbow surgery Left   . Rotator cuff repair Right     FAMILY HISTORY: No family history on file.  SOCIAL HISTORY: History   Social History  . Marital Status: Single    Spouse Name: N/A    Number of Children: 3  . Years of Education: N/A   Occupational History  .  [employer]   Social History Main Topics  . Smoking status: Former Games developer  . Smokeless tobacco: Never Used  . Alcohol Use: Yes     Comment: beer occasionally  . Drug Use: No  . Sexual Activity: Yes   Other Topics Concern  . Not on file   Social History Narrative  Patient lives at home with family.   Caffeine Use: 3 cups daily     PHYSICAL EXAM  Filed Vitals:   03/17/13 1014  BP: 131/87  Pulse: 88  Height: 5' 9.5" (1.765 m)  Weight: 217 lb (98.431 kg)   Body mass index is 31.6 kg/(m^2).  Generalized: Well developed, in no acute distress  Head: normocephalic and atraumatic. Oropharynx benign  Neck: Supple, no carotid bruits  Cardiac: Regular rate rhythm, no murmur  Musculoskeletal: No deformity   Neurological examination   Mentation: Alert oriented to time, place, history taking. Follows all commands speech and language fluent Cranial nerve II-XII: Extraocular tracking shows no saccadic breakdown without nystagmus  noted. There is no limitation to his gaze. There is mild decrease in eye blink rate. Hearing is intact.  Face is symmetric with mild facial masking and normal facial sensation. There is no lip, neck or jaw tremor. Neck is mildly rigid with intact passive ROM.  Oropharynx exam reveals mild mouth dryness.  Tongue protrudes centrally and palate elevates symmetrically. There is no drooling.  head turning and shoulder shrug and were normal and symmetric.Tongue protrusion into cheek strength was normal. Motor: normal bulk, full strength in the BUE, BLE, no pronator drift. No focal weakness. Tone is mildly rigid on the L, with presence of cogwheeling in the left upper extremity. There is overall no significant bradykinesia. There is no drift or rebound. There is a moderate resting tremor in the LUE upper extremity and no resting tremor elsewhere. The tremor is fairly constant.  Fine motor skills exam: Finger taps are mildly impaired on the R and mild to moderately impaired on the left. Hand movements are normal on the right and mildly impaired on the left. RAP (rapid alternating patting) is normal on the right and normal to minimally impaired on the left. Foot taps are minimally impaired on the right and mildly impaired on the left. Foot agility (in the form of heel stomping) is not impaired on the right and mildly impaired on the left.  Sensory: normal and symmetric to light touch, pinprick, and  vibration  Coordination: finger-nose-finger, heel-to-shin bilaterally, no dysmetria Reflexes:Reflexes are 2+ in the upper extremities and 2+ in the lower extremities.  Gait and Station: Gait, station and balance: He stands up from the seated position with no signficant difficulty and does not need to push up with His hands. He needs no assistance. No veering to one side is noted. He is not noted to lean to the side. Posture is minimally stooped for age. Stance is narrow-based. He walks with normal stride length and pace and  decreased arm swing on the left. He turns in en bloc. Tandem walk is possible. Balance is not impaired. He is able to do a toe and heel stance.   DIAGNOSTIC DATA (LABS, IMAGING, TESTING) - I reviewed patient records, labs, notes, testing and imaging myself where available.  Lab Results  Component Value Date   WBC 5.5 03/03/2013   HGB 12.9* 03/03/2013   HCT 38.0* 03/03/2013   MCV 86.9 03/03/2013   PLT 181 03/03/2013      Component Value Date/Time   NA 141 03/03/2013 0531   K 3.8 03/03/2013 0531   CL 103 03/03/2013 0531   CO2 27 03/03/2013 0517   GLUCOSE 196* 03/03/2013 0531   BUN 15 03/03/2013 0531   CREATININE 1.00 03/03/2013 0531   CALCIUM 9.0 03/03/2013 0517   PROT 6.2 03/03/2013 0517   ALBUMIN 3.4* 03/03/2013 8295  AST 13 03/03/2013 0517   ALT 19 03/03/2013 0517   ALKPHOS 53 03/03/2013 0517   BILITOT 0.2* 03/03/2013 0517   GFRNONAA >90 03/03/2013 0517   GFRAA >90 03/03/2013 0517    MRI Brain Wo Contrast   11/01/12 Mildly abnormal MRI scan of the brain showing mildchanges of chronic microvascular ischemia and generalized cerebral atrophy.  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  Dr. Frances Furbish and I had a long chat with the patient about treatment options. He has tried Azilect without benefit and with SE reported. He did not tolerate the Neupro patch.  We will try Artane 2mg  , 1/2 a pill at night for 1 week, then 1/2 pill in the morning and a whole pill at night for 1 week, then 1 whole tablet both day and night.  We talked to him about potential side effects, which are dry mouth, possible urinary retention, constipation, Blurry vision, possibly drowsiness. He demonstrated understanding and agreement with the plan. I've encouraged him to call with any interim questions, concerns or problems are updates. He is to keep his next scheduled  appointment in December with Dr. Frances Furbish.   No orders of the defined types were placed in this encounter.    Meds ordered this encounter  Medications  . trihexyphenidyl (ARTANE) 2 MG tablet    Sig: 1/2 a pill at night for 1 week, then a whole pill at night for 1 week, then 1/2 pill in the morning and a whole pill at night for 1 week, then 1 whole tablet both day and night.    Dispense:  60 tablet    Refill:  1    Order Specific Question:  Supervising Provider    Answer:  Huston Foley [[id number]]    Tawny Asal LAM, MSN, NP-C 03/17/2013, 12:20 PM [facility] [address], [address] [place], [place] [address] [phone]  I reviewed the above note and documentation by the Nurse Practitioner and agree with the history, physical exam, assessment and plan as outlined above. In addition I also had a normal conversation with the patient, reviewing his prior medication side effects and the new plan.

## 2013-05-15 ENCOUNTER — Ambulatory Visit: Payer: BC Managed Care – PPO | Admitting: Neurology

## 2013-06-11 ENCOUNTER — Other Ambulatory Visit: Payer: Self-pay | Admitting: Nurse Practitioner

## 2013-11-10 ENCOUNTER — Encounter: Payer: Self-pay | Admitting: Neurology

## 2013-11-10 ENCOUNTER — Encounter (INDEPENDENT_AMBULATORY_CARE_PROVIDER_SITE_OTHER): Payer: Self-pay

## 2013-11-10 ENCOUNTER — Ambulatory Visit (INDEPENDENT_AMBULATORY_CARE_PROVIDER_SITE_OTHER): Payer: BC Managed Care – PPO | Admitting: Neurology

## 2013-11-10 VITALS — BP 128/82 | HR 100 | Temp 98.2°F | Ht 69.5 in | Wt 210.0 lb

## 2013-11-10 DIAGNOSIS — G20 Parkinson's disease: Secondary | ICD-10-CM

## 2013-11-10 MED ORDER — CARBIDOPA-LEVODOPA ER 23.75-95 MG PO CPCR: 95.0000 mg | ORAL_CAPSULE | Freq: Three times a day (TID) | ORAL | Status: DC

## 2013-11-10 NOTE — Patient Instructions (Signed)
1. We will continue Artane 2 mg two times a day.   2. We will start you on Rytary 95 mg strength: take 1 pill three times a day for one week, then 2 pills three times a day for 1 week, then 3 pills 3 times a day. Take Rytary away from your mealtimes for better absorption, 1 hour before or 2 hours after a meal. Side effects are nausea, lightheadedness, headaches. I will give you a voucher for a free trial.   4. Call in about 7-10 days to report, how Rytary is working for you. At that time I will give you a new prescription and we can fine tune your dose.   5. Walk 30 minutes daily.   6. Drink enough water.    7. Follow up in 3 months.
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
## 2013-12-10 ENCOUNTER — Other Ambulatory Visit: Payer: Self-pay | Admitting: Neurology

## 2013-12-10 ENCOUNTER — Telehealth: Payer: Self-pay | Admitting: Neurology

## 2013-12-10 NOTE — Telephone Encounter (Signed)
Called pt to set up appt per Dr. Frances FurbishAthar with Larita FifeLynn, NP on 12/12/13. I advised the pt that if he has any other problems, questions or concerns to call the office. Pt verbalized understanding.

## 2013-12-10 NOTE — Telephone Encounter (Signed)
Okay to schedule for earlier appt with Heide GuileLynn Lam and keep my FU appt on file for him, please advise pt
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
## 2013-12-12 ENCOUNTER — Encounter: Payer: Self-pay | Admitting: Nurse Practitioner

## 2013-12-12 ENCOUNTER — Ambulatory Visit (INDEPENDENT_AMBULATORY_CARE_PROVIDER_SITE_OTHER): Payer: BC Managed Care – PPO | Admitting: Nurse Practitioner

## 2013-12-12 VITALS — BP 126/87 | HR 86 | Temp 98.0°F | Ht 70.0 in | Wt 206.5 lb

## 2013-12-12 DIAGNOSIS — G20 Parkinson's disease: Secondary | ICD-10-CM

## 2013-12-12 DIAGNOSIS — G20A1 Parkinson's disease without dyskinesia, without mention of fluctuations: Secondary | ICD-10-CM

## 2013-12-12 MED ORDER — CARBIDOPA-LEVODOPA ER 23.75-95 MG PO CPCR: 4.0000 | ORAL_CAPSULE | Freq: Four times a day (QID) | ORAL | Status: DC

## 2013-12-12 MED ORDER — CARBIDOPA-LEVODOPA ER 23.75-95 MG PO CPCR: 4.0000 | ORAL_CAPSULE | Freq: Three times a day (TID) | ORAL | Status: DC

## 2013-12-12 NOTE — Patient Instructions (Addendum)
Continue Rytary 95 mg strength: take 4 pills three times a day, again, away from your mealtimes!  Side effects can be nausea, diarrhea.  We are referring you to Dr. Leonides CaveZelson, for neuropsychological testing.  His office will contact you to schedule this appointment.  After we get this report, we will write a letter for your company to drive.  Walk 30 minutes daily.   Keep next scheduled Follow up with Dr. Frances FurbishAthar.
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
## 2013-12-16 ENCOUNTER — Telehealth: Payer: Self-pay | Admitting: Neurology

## 2013-12-16 NOTE — Telephone Encounter (Signed)
Dr. Frances FurbishAthar, what would you like to do...try to fight it or go back to Sinemet?

## 2013-12-16 NOTE — Telephone Encounter (Signed)
Please advise 

## 2013-12-16 NOTE — Telephone Encounter (Signed)
I contacted ins, who states they have denied our request for coverage on Rytary, as it is not a covered drug on the benefit plan.   Patient is requesting a drug change due to this denial.  Please advise.  Thank you.

## 2013-12-16 NOTE — Telephone Encounter (Signed)
Patient calling to state that his insurance is not covering Rytary and is requesting a different medication, please return call to patient and advise.

## 2013-12-17 NOTE — Telephone Encounter (Signed)
Shanda BumpsJessica, is there a way your can get in touch with Donnald GarreLara, the Rytary rep and see if there is any info/advise she can give us for appeal or getting it approved? thx

## 2013-12-17 NOTE — Telephone Encounter (Signed)
I contacted the plan.  A letter of appeal may be written to try and get the denial overturned if desired.  They require a written letter of medical necessity from the provider along with copies of any clinical information relevant to the appeal.  This request will need to be sent to Jesse Robinson, Appeal Coordinator for the patient's plan ([employer] [place]).  Address is [address] [place] [id number], or faxed to [phone].  We can mark the request urgent to ask for expedited appeal, with clear specification of reasoning for urgency.  Otherwise they have 30 days to review the appeal request.  For further grievance issues that need to be addressed, the [facility] [employer] can be contacted at [address] [place] [phone].   Please advise.  Thank you.

## 2013-12-19 ENCOUNTER — Other Ambulatory Visit: Payer: Self-pay | Admitting: Nurse Practitioner

## 2013-12-19 MED ORDER — CARBIDOPA-LEVODOPA 25-100 MG PO TABS: 1.0000 | ORAL_TABLET | Freq: Three times a day (TID) | ORAL | Status: DC

## 2013-12-19 NOTE — Telephone Encounter (Signed)
I called back.  Got no answer.  Left message.  

## 2013-12-19 NOTE — Telephone Encounter (Signed)
Jess, I do not know if Dr. Frances FurbishAthar repsonded to this or not.  She is out today.  I will change him to Sinemet 25/100 mg three times daily, we can adjust later as needed.  Please give him a call to let him know. Thanks. LL

## 2013-12-19 NOTE — Progress Notes (Signed)
Rytary was declined by patient's insurance.  I will change him to Sinemet 25/100 mg three times daily, we can adjust later as needed.

## 2013-12-22 NOTE — Telephone Encounter (Signed)
Agree with C/L 25/100 tid. Sorry for the delay. thx

## 2014-01-01 ENCOUNTER — Other Ambulatory Visit: Payer: Self-pay | Admitting: Nurse Practitioner

## 2014-01-01 DIAGNOSIS — G20 Parkinson's disease: Secondary | ICD-10-CM

## 2014-01-01 DIAGNOSIS — G20A1 Parkinson's disease without dyskinesia, without mention of fluctuations: Secondary | ICD-10-CM

## 2014-01-02 ENCOUNTER — Telehealth: Payer: Self-pay | Admitting: Nurse Practitioner

## 2014-01-02 NOTE — Telephone Encounter (Signed)
Dr. Leonides CaveZelson calling to speak with Larita FifeLynn regarding patient, states it is not urgent, please call when available.

## 2014-01-08 NOTE — Telephone Encounter (Signed)
Noted  

## 2014-02-13 ENCOUNTER — Other Ambulatory Visit: Payer: Self-pay

## 2014-02-13 ENCOUNTER — Telehealth: Payer: Self-pay

## 2014-02-13 MED ORDER — CARBIDOPA-LEVODOPA ER 23.75-95 MG PO CPCR: 1.0000 | ORAL_CAPSULE | ORAL | Status: DC

## 2014-02-13 NOTE — Telephone Encounter (Signed)
I would appreciate if you can get him started on Rytary 95 mg 1 tid x 1 week, then 2 tid for 1 week, the 3 pills tid thereafter, #270, with 3 refills. Thanks, please call pt for instructions as well.  Can he use a copay card?

## 2014-02-13 NOTE — Telephone Encounter (Signed)
Yes, that would be great! Thanks. LL

## 2014-02-13 NOTE — Telephone Encounter (Signed)
[employer] [place] notified us they have re-reviewed our request for coverage on Rytary , and have determined they will grant an approval for coverage on 23.75-95mg  caps #360 per 30 days effective until 02/14/2015 (unless policy changes prior to that date).  Ref ID # [id number].  They indicate the patient has also been notified of this decision.

## 2014-02-13 NOTE — Telephone Encounter (Signed)
I called and spoke with the patient.  Explained ins has now decided they will cover Rytary.  Went over titration instructions.  Patient verbalized understanding.  Rx has been sent to [facility] per patient request.  I downloaded a discount voucher online for pharmacy to use.  (BIN: [id number]  PCN: [id number]  Group: [id number]  ID: [id number])

## 2014-03-11 DIAGNOSIS — Z0289 Encounter for other administrative examinations: Secondary | ICD-10-CM

## 2014-04-07 ENCOUNTER — Ambulatory Visit: Payer: BC Managed Care – PPO | Admitting: Neurology

## 2014-04-20 ENCOUNTER — Ambulatory Visit (INDEPENDENT_AMBULATORY_CARE_PROVIDER_SITE_OTHER): Payer: BC Managed Care – PPO | Admitting: Neurology

## 2014-04-20 ENCOUNTER — Encounter: Payer: Self-pay | Admitting: Neurology

## 2014-04-20 VITALS — BP 125/81 | HR 87 | Temp 96.8°F | Ht 70.0 in | Wt 210.0 lb

## 2014-04-20 DIAGNOSIS — G20 Parkinson's disease: Secondary | ICD-10-CM

## 2014-04-20 MED ORDER — CARBIDOPA-LEVODOPA ER 23.75-95 MG PO CPCR: 380.0000 mg | ORAL_CAPSULE | Freq: Three times a day (TID) | ORAL | Status: DC
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
## 2014-04-20 NOTE — Patient Instructions (Signed)
Let's increase your Rytary to 4 pills 3 times a day. We will see you back in 3 months.

## 2014-04-24 DIAGNOSIS — Z0271 Encounter for disability determination: Secondary | ICD-10-CM

## 2014-07-20 ENCOUNTER — Ambulatory Visit: Payer: BC Managed Care – PPO | Admitting: Neurology

## 2014-07-21 ENCOUNTER — Ambulatory Visit (INDEPENDENT_AMBULATORY_CARE_PROVIDER_SITE_OTHER): Payer: BLUE CROSS/BLUE SHIELD | Admitting: Neurology

## 2014-07-21 ENCOUNTER — Encounter: Payer: Self-pay | Admitting: Neurology

## 2014-07-21 VITALS — BP 116/80 | HR 80 | Temp 97.1°F | Ht 70.0 in | Wt 201.0 lb

## 2014-07-21 DIAGNOSIS — G20 Parkinson's disease: Secondary | ICD-10-CM

## 2014-07-21 MED ORDER — CARBIDOPA-LEVODOPA ER 36.25-145 MG PO CPCR: 435.0000 mg | ORAL_CAPSULE | Freq: Three times a day (TID) | ORAL | Status: DC

## 2014-07-21 NOTE — Patient Instructions (Addendum)
  Please remember, that any kind of tremor may be exacerbated by anxiety, anger, nervousness, excitement, dehydration, sleep deprivation, by caffeine, and low blood sugar values or blood sugar fluctuations. Some medications, especially some antidepressants and lithium can cause or exacerbate tremors. Tremors may temporarily calm down her subside with the use of a benzodiazepine such as Valium or related medications and with alcohol. Be aware however that drinking alcohol is not an approved treatment or appropriate treatment for tremor control and long-term use of benzodiazepines such as Valium, lorazepam, alprazolam, or clonazepam can cause habit formation, physical and psychological addiction.  Drink more water!  Do not use dangerous tools or machinery.
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
## 2014-08-31 ENCOUNTER — Other Ambulatory Visit: Payer: Self-pay | Admitting: Physician Assistant

## 2014-08-31 DIAGNOSIS — R1031 Right lower quadrant pain: Secondary | ICD-10-CM

## 2014-09-07 ENCOUNTER — Ambulatory Visit
Admission: RE | Admit: 2014-09-07 | Discharge: 2014-09-07 | Disposition: A | Discharge status: Home / Self Care | Payer: BLUE CROSS/BLUE SHIELD | Source: Ambulatory Visit | Attending: Physician Assistant | Admitting: Physician Assistant

## 2014-09-07 DIAGNOSIS — R1031 Right lower quadrant pain: Secondary | ICD-10-CM

## 2014-11-30 ENCOUNTER — Encounter: Payer: Self-pay | Admitting: Neurology

## 2014-11-30 ENCOUNTER — Ambulatory Visit (INDEPENDENT_AMBULATORY_CARE_PROVIDER_SITE_OTHER): Payer: BLUE CROSS/BLUE SHIELD | Admitting: Neurology

## 2014-11-30 VITALS — BP 115/72 | HR 71 | Resp 16 | Ht 70.0 in | Wt 190.0 lb

## 2014-11-30 DIAGNOSIS — G20 Parkinson's disease: Secondary | ICD-10-CM | POA: Diagnosis not present

## 2014-11-30 MED ORDER — CARBIDOPA-LEVODOPA ER 36.25-145 MG PO CPCR: 580.0000 mg | ORAL_CAPSULE | Freq: Three times a day (TID) | ORAL | Status: DC

## 2014-11-30 NOTE — Patient Instructions (Signed)
We will increase your Rytary to 145 mg 4 pills three times a day.  [facility] with Dr. Rubin PayorSiddiqui.
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
## 2015-03-16 ENCOUNTER — Telehealth: Payer: Self-pay | Admitting: Neurology

## 2015-03-16 NOTE — Telephone Encounter (Signed)
Ins has been contacted and provided with clinical info. Request is under review Ref # [id number]

## 2015-03-16 NOTE — Telephone Encounter (Signed)
Patient called regarding Carbidopa-Levodopa ER (RYTARY) 36.25-145 MG CPCR. He's having trouble getting medication filled. Insurance is needing Prior Auth.

## 2015-03-18 NOTE — Telephone Encounter (Signed)
[employer] [place] approved the request for continuation of coverage on Rytary effective until 03/15/2018, or until the policy changes or is terminated Ref ID# [id number]; Cust Serv # [phone].  Ins has notified the patient of this decision.  We have notified the pharmacy, and they will contact patient when Rx is ready for pick up.

## 2015-04-12 ENCOUNTER — Ambulatory Visit: Payer: BLUE CROSS/BLUE SHIELD | Admitting: Neurology

## 2015-05-06 ENCOUNTER — Ambulatory Visit: Payer: BLUE CROSS/BLUE SHIELD | Admitting: Neurology

## 2015-05-06 ENCOUNTER — Telehealth: Payer: Self-pay

## 2015-05-06 NOTE — Telephone Encounter (Signed)
Patient did not show to follow up appt.

## 2015-05-07 ENCOUNTER — Encounter: Payer: Self-pay | Admitting: Neurology

## 2015-06-13 ENCOUNTER — Telehealth: Payer: Self-pay

## 2015-06-13 NOTE — Telephone Encounter (Signed)
I called patient's listed number but it was a fax number. I called wife's phone and left a message saying that the office is closed 06/14/15 due to weather and to call back on Tuesday to reschedule.

## 2015-06-14 ENCOUNTER — Ambulatory Visit: Payer: BLUE CROSS/BLUE SHIELD | Admitting: Neurology

## 2015-06-17 ENCOUNTER — Other Ambulatory Visit: Payer: Self-pay | Admitting: Neurology

## 2015-06-28 ENCOUNTER — Ambulatory Visit: Payer: BLUE CROSS/BLUE SHIELD | Admitting: Neurology

## 2015-07-15 ENCOUNTER — Other Ambulatory Visit: Payer: Self-pay | Admitting: Neurology

## 2015-07-19 ENCOUNTER — Ambulatory Visit: Payer: BLUE CROSS/BLUE SHIELD | Admitting: Neurology

## 2015-07-26 ENCOUNTER — Telehealth: Payer: Self-pay | Admitting: Neurology

## 2015-07-26 NOTE — Telephone Encounter (Signed)
Patient called regarding RYTARY 23.75-95 MG CPCR, states he was taking 4 capsules before, now he's taking 3. Requests appointment today or tomorrow to see Dr. Frances Furbish.

## 2015-07-26 NOTE — Telephone Encounter (Signed)
I spoke to patient and I was able to get patient in tomorrow to see Dr. Frances Furbish.

## 2015-07-27 ENCOUNTER — Ambulatory Visit (INDEPENDENT_AMBULATORY_CARE_PROVIDER_SITE_OTHER): Payer: BLUE CROSS/BLUE SHIELD | Admitting: Neurology

## 2015-07-27 ENCOUNTER — Encounter: Payer: Self-pay | Admitting: Neurology

## 2015-07-27 VITALS — BP 127/75 | HR 80 | Resp 16 | Ht 70.0 in | Wt 205.0 lb

## 2015-07-27 DIAGNOSIS — G20 Parkinson's disease: Secondary | ICD-10-CM | POA: Diagnosis not present

## 2015-07-27 MED ORDER — CARBIDOPA-LEVODOPA ER 36.25-145 MG PO CPCR: 580.0000 mg | ORAL_CAPSULE | Freq: Three times a day (TID) | ORAL | Status: DC
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
## 2015-08-05 ENCOUNTER — Telehealth: Payer: Self-pay

## 2015-08-05 NOTE — Telephone Encounter (Signed)
Received PA request for Rytary. Submitted on CoverMyMeds. Received fax from [place] saying "Member filled 07/27/2015. Auth already on file." It looks as though his insurance will cover 30day supply,  And he may have tried to get 30 day supply which triggered PA.

## 2015-08-30 ENCOUNTER — Ambulatory Visit: Payer: BLUE CROSS/BLUE SHIELD | Admitting: Neurology

## 2015-11-22 ENCOUNTER — Encounter: Payer: Self-pay | Admitting: Neurology

## 2015-11-22 ENCOUNTER — Ambulatory Visit (INDEPENDENT_AMBULATORY_CARE_PROVIDER_SITE_OTHER): Payer: BLUE CROSS/BLUE SHIELD | Admitting: Neurology

## 2015-11-22 VITALS — BP 136/86 | HR 91 | Resp 16 | Ht 70.0 in | Wt 202.0 lb

## 2015-11-22 DIAGNOSIS — G20 Parkinson's disease: Secondary | ICD-10-CM

## 2015-11-22 MED ORDER — CARBIDOPA-LEVODOPA ER 48.75-195 MG PO CPCR: 585.0000 mg | ORAL_CAPSULE | Freq: Four times a day (QID) | ORAL | Status: DC

## 2015-11-22 NOTE — Patient Instructions (Addendum)
I think we will increase your Rytary one more time by switching you to 195 mg pills: take 3 pills 4 times a day.
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
## 2015-11-24 ENCOUNTER — Other Ambulatory Visit: Payer: Self-pay | Admitting: Neurology

## 2016-02-28 ENCOUNTER — Ambulatory Visit (INDEPENDENT_AMBULATORY_CARE_PROVIDER_SITE_OTHER): Payer: BLUE CROSS/BLUE SHIELD | Admitting: Neurology

## 2016-02-28 ENCOUNTER — Encounter: Payer: Self-pay | Admitting: Neurology

## 2016-02-28 VITALS — BP 136/70 | HR 80 | Resp 16 | Ht 70.0 in | Wt 202.0 lb

## 2016-02-28 DIAGNOSIS — G20 Parkinson's disease: Secondary | ICD-10-CM | POA: Diagnosis not present

## 2016-02-28 MED ORDER — CARBIDOPA-LEVODOPA ER 48.75-195 MG PO CPCR: 585.0000 mg | ORAL_CAPSULE | Freq: Four times a day (QID) | ORAL | 5 refills | Status: DC
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
## 2016-02-28 NOTE — Patient Instructions (Signed)
We will keep your meds the same.  As discussed, I will make a referral to [facility] for deep brain stimulation (DBS) for treatment of Parkinson's disease.   You should hear back from the neurology department at [facility] directly. If you don't hear back in the next couple of weeks from them, call us and we will inquire about the status of the referral. Please keep in mind, that it may take several weeks or even a few months to get in to see the neurologist.

## 2016-06-16 ENCOUNTER — Telehealth: Payer: Self-pay | Admitting: Neurology

## 2016-06-16 NOTE — Telephone Encounter (Signed)
Christy/[facility] [phone] called said she was returning Diana's call. She said the pt has appt with Neurosurgeon on 1/22 and neuropsyc on 2/17. She said pt has had multiple c/a and no shows and that is why it is taking so long. She said he has been seen by Dr Arlana Pouchate.

## 2016-06-19 ENCOUNTER — Ambulatory Visit (INDEPENDENT_AMBULATORY_CARE_PROVIDER_SITE_OTHER): Payer: BLUE CROSS/BLUE SHIELD | Admitting: Neurology

## 2016-06-19 ENCOUNTER — Encounter: Payer: Self-pay | Admitting: Neurology

## 2016-06-19 VITALS — BP 130/72 | HR 76 | Resp 16 | Ht 70.0 in | Wt 206.0 lb

## 2016-06-19 DIAGNOSIS — G20 Parkinson's disease: Secondary | ICD-10-CM

## 2016-06-19 DIAGNOSIS — F439 Reaction to severe stress, unspecified: Secondary | ICD-10-CM | POA: Diagnosis not present

## 2016-06-19 MED ORDER — CARBIDOPA-LEVODOPA ER 48.75-195 MG PO CPCR: 585.0000 mg | ORAL_CAPSULE | Freq: Four times a day (QID) | ORAL | 5 refills | Status: DC
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
## 2016-06-19 NOTE — Patient Instructions (Signed)
We will continue with your current medications and await evaluations at [facility].

## 2016-08-28 ENCOUNTER — Other Ambulatory Visit: Payer: Self-pay | Admitting: Physician Assistant

## 2016-08-28 DIAGNOSIS — R1011 Right upper quadrant pain: Secondary | ICD-10-CM

## 2016-09-18 ENCOUNTER — Other Ambulatory Visit: Payer: Self-pay | Admitting: Physician Assistant

## 2016-09-18 ENCOUNTER — Ambulatory Visit
Admission: RE | Admit: 2016-09-18 | Discharge: 2016-09-18 | Disposition: A | Discharge status: Home / Self Care | Payer: BLUE CROSS/BLUE SHIELD | Source: Ambulatory Visit | Attending: Physician Assistant | Admitting: Physician Assistant

## 2016-09-18 DIAGNOSIS — R0602 Shortness of breath: Secondary | ICD-10-CM

## 2016-09-18 DIAGNOSIS — R1011 Right upper quadrant pain: Secondary | ICD-10-CM

## 2016-10-23 ENCOUNTER — Telehealth: Payer: Self-pay

## 2016-10-23 ENCOUNTER — Ambulatory Visit: Payer: BLUE CROSS/BLUE SHIELD | Admitting: Neurology

## 2016-10-23 NOTE — Telephone Encounter (Signed)
Patient did not show to appt today  

## 2016-10-26 ENCOUNTER — Ambulatory Visit: Payer: BLUE CROSS/BLUE SHIELD | Admitting: Neurology

## 2016-10-26 ENCOUNTER — Telehealth: Payer: Self-pay

## 2016-10-26 NOTE — Telephone Encounter (Signed)
Patient did not show to appt today  

## 2016-10-31 ENCOUNTER — Telehealth: Payer: Self-pay | Admitting: Neurology

## 2016-10-31 NOTE — Telephone Encounter (Signed)
Pt called to r.s appt. He was a no show on 10/26/16. Pt aware he needs to speak with billing before an appt can be r/s. Thank you

## 2016-11-16 ENCOUNTER — Telehealth: Payer: Self-pay

## 2016-11-16 NOTE — Telephone Encounter (Signed)
I called pt to discuss. No answer, left a message asking him to call me back. 

## 2016-11-16 NOTE — Telephone Encounter (Signed)
Received a note from [facility] in [place] asking if Dr. Frances FurbishAthar will change pt's RX from Rytary to generic sinemet CR 50/200 due to no insurance and cost of rytary being $1200.00.

## 2016-11-16 NOTE — Telephone Encounter (Signed)
Please call patient to clarify, what he needs, and if he has no ins coverage any longer? In that case we may have to switch to sinemet. Does he have enough med till the next appt? He has appt in Aug and no showed in May.

## 2016-11-17 NOTE — Telephone Encounter (Signed)
Spoke to pt and he has no insurance right now, one month with new job (working in [place] right now).  He states that he has been out of his rytary for 2 days.  Would like to go try sinemet cr 50/200 tabs.  Currently taking rytary 48.75-195mg   3 tabs QID.  This will cost him $288.00 vs $1200.00.  If this is ok. Will send to Dr. Frances FurbishAthar and Dr. Eulah CitizenYan WID.

## 2016-11-17 NOTE — Telephone Encounter (Signed)
Dr. Frances FurbishAthar will come back November 20 2016, she can better adjust the dose of levodopa

## 2016-11-17 NOTE — Telephone Encounter (Signed)
Patient called office returning RN's call.  Please call °

## 2016-11-20 MED ORDER — CARBIDOPA-LEVODOPA 25-100 MG PO TABS: 2.0000 | ORAL_TABLET | Freq: Every day | ORAL | 5 refills | Status: DC

## 2016-11-20 NOTE — Telephone Encounter (Signed)
We can switch him to Sinemet immediate release 25-100 milligrams strength but it can be difficult to switch from one type of levodopa to another. I suggested he start Sinemet 25-100 milligrams strength 2 pills 5 times a day at 6, 9, 12, 3 PM and 6 PM for now. He will have to stop taking the Rytary.  He may experience set backs in his PD symptoms, including new symptoms, such as sleepiness, abnormal involuntary movements, dizziness, lightheadedness, nausea, vomiting, more tremor, freezing episodes, hallucinations. Please call back and advise patient of the new prescription, the instructions and potential side effects.

## 2016-11-20 NOTE — Telephone Encounter (Signed)
Patient called office in reference to message taken on Friday per patient he has about a day and a half of medication left.  Please call

## 2016-11-20 NOTE — Addendum Note (Signed)
Addended by: Huston FoleyATHAR, Jael Kostick on: 11/20/2016 02:40 PM   Modules accepted: Orders

## 2016-11-20 NOTE — Telephone Encounter (Signed)
I called pt and explained Dr. Teofilo PodAthar's recommendations. Pt is agreeable to sinemet 2 tablets 5 times daily. Potential medication side effects were discussed with the patient; pt will call me if he experiences any side effects. I reminded pt of his appt in August with Dr. Frances FurbishAthar. Pt verbalized understanding of these recommendations.

## 2017-01-04 ENCOUNTER — Encounter: Payer: Self-pay | Admitting: Neurology

## 2017-01-04 ENCOUNTER — Ambulatory Visit (INDEPENDENT_AMBULATORY_CARE_PROVIDER_SITE_OTHER): Payer: Self-pay | Admitting: Neurology

## 2017-01-04 VITALS — BP 138/87 | HR 86 | Ht 70.0 in | Wt 207.0 lb

## 2017-01-04 DIAGNOSIS — G20 Parkinson's disease: Secondary | ICD-10-CM

## 2017-01-04 NOTE — Patient Instructions (Addendum)
We will keep your Sinemet the same.  May sure, you allow for enough sleep time and take breaks when driving.  Do not multitask while driving, such as eating or using your cell phone! Once you get insurance kick back in, please get in [facility] to [facility] your DBS evaluation with your scans. Dr. Angelyn Puntatter felt you are a good candidate.  You were felt to have no contraindications for surgery from the psychologist's standpoint.
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
## 2017-02-19 ENCOUNTER — Telehealth: Payer: Self-pay | Admitting: Neurology

## 2017-02-19 NOTE — Telephone Encounter (Signed)
Patient is calling to get a note saying he can drive while taking carbidopa-levodopa (SINEMET IR) 25-100 MG tablet. Please fax to [phone] ATTN: Dr. Mancel Bale.

## 2017-02-19 NOTE — Telephone Encounter (Signed)
I spoke with Dr. Frances Furbish, she recommends that the driving letter come from pt's PCP, she cannot know that the pt is taking the sinemet as prescribed. The sinemet has no effect on his ability to drive and PCP can address all concerns regarding pt's driving ability. I called pt. He is agreeable to this and will contact his PCP.

## 2017-02-20 ENCOUNTER — Telehealth: Payer: Self-pay | Admitting: Neurology

## 2017-02-20 NOTE — Telephone Encounter (Signed)
Pt request new RX for Rytary sent to [facility], [place]. Pt said carbidopa-levodopa (SINEMET IR) 25-100 MG tablet is giving him HA's, makes him sleepy. Pt said he does not have any side effects from rytary.  New insurance: [employer]/[phone] ID# [id number] GRP# (pt did not see this) RX WJX#[id number]

## 2017-02-21 NOTE — Telephone Encounter (Signed)
I spoke with Dr. Frances Furbish. She does not feel comfortable switching the pt back to rytary remotely and will need pt to come to an office visit to discuss. S/E for rytary cannot be ruled out if pt has them from sinemet and she needs to discuss the titration schedule with pt again. Dr. Frances Furbish also recommended that pt establish care with a neurologist in [place] if he will be working up there. Pt can have a sooner appt with Dr. Frances Furbish if he wants to go back on Rytary or he can see Dr. Frances Furbish in December.  I called pt to discuss. No answer, left a message asking him to call me back.

## 2017-02-22 NOTE — Telephone Encounter (Signed)
Pt returned my call. I explained that Dr. Frances Furbish does not feel comfortable switching pt back to rytary from sinemet if pt is having side effects from sinemet. I asked pt if he would come in for a sooner appt with Dr. Frances Furbish to discuss, and he is agreeable to this, and an appt was made for 02/27/17 at 2:00pm. Pt says that he is thinking about stopping his job that is taking him to [place]. Pt verbalized understanding of appt date and time.

## 2017-02-27 ENCOUNTER — Encounter: Payer: Self-pay | Admitting: Neurology

## 2017-02-27 ENCOUNTER — Ambulatory Visit (INDEPENDENT_AMBULATORY_CARE_PROVIDER_SITE_OTHER): Payer: BLUE CROSS/BLUE SHIELD | Admitting: Neurology

## 2017-02-27 VITALS — BP 130/89 | HR 111 | Ht 70.0 in | Wt 209.0 lb

## 2017-02-27 DIAGNOSIS — G20 Parkinson's disease: Secondary | ICD-10-CM

## 2017-02-27 MED ORDER — CARBIDOPA-LEVODOPA ER 48.75-195 MG PO CPCR: 585.0000 mg | ORAL_CAPSULE | Freq: Three times a day (TID) | ORAL | 5 refills | Status: DC

## 2017-02-27 NOTE — Patient Instructions (Addendum)
As discussed, we will switch you back to the Rytary, as you felt better on the Rytary, and feel less well treated on the Sinemet.   You can stop the Sinemet and start the Rytary 195 mg strength: 3 pills 3 times a day.   Please schedule your follow up with [facility] as soon as possible.
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
## 2017-04-16 ENCOUNTER — Telehealth: Payer: Self-pay | Admitting: Neurology

## 2017-04-16 DIAGNOSIS — G20 Parkinson's disease: Secondary | ICD-10-CM

## 2017-04-16 MED ORDER — CARBIDOPA-LEVODOPA ER 48.75-195 MG PO CPCR: 585.0000 mg | ORAL_CAPSULE | Freq: Four times a day (QID) | ORAL | 5 refills | Status: DC

## 2017-04-16 NOTE — Telephone Encounter (Signed)
I called pt to clarify his dosing. He reports that he has increased his rytary to 3 tablets 4 times a day since his hours have increased to 14 hours per day. Pt says that the increase in rytary has helped his tremors. He needs a refill reflecting this dose change.

## 2017-04-16 NOTE — Telephone Encounter (Signed)
Pt called his hours at work has been increased to 14 per day. Pt said he has been on this shift for the past 3 weeks and self increased Carbidopa-Levodopa ER (RYTARY) 48.75-195 MG CPCR to 4 x days 8pm,12 midnight, 4am, 8 am. He is needing to have refill increasing qty and directions sent to [facility]. Pt said he has 1 day left. Please call send asap.

## 2017-04-16 NOTE — Telephone Encounter (Signed)
I believe it's okay to increase Rytary 195 mg 3 pills to qid from tid. Rx done, sent to his pharm in [place] as requested.

## 2017-05-10 ENCOUNTER — Telehealth: Payer: Self-pay

## 2017-05-10 ENCOUNTER — Ambulatory Visit: Payer: Self-pay | Admitting: Neurology

## 2017-05-10 NOTE — Telephone Encounter (Signed)
Pt did not show for their appt with Dr. Athar today.  

## 2017-05-18 ENCOUNTER — Encounter: Payer: Self-pay | Admitting: Neurology

## 2017-09-23 ENCOUNTER — Other Ambulatory Visit: Payer: Self-pay | Admitting: Neurology

## 2017-09-23 DIAGNOSIS — G20 Parkinson's disease: Secondary | ICD-10-CM

## 2017-09-25 ENCOUNTER — Other Ambulatory Visit: Payer: Self-pay | Admitting: Neurology

## 2017-09-25 DIAGNOSIS — G20 Parkinson's disease: Secondary | ICD-10-CM

## 2017-09-26 ENCOUNTER — Telehealth: Payer: Self-pay | Admitting: Neurology

## 2017-09-26 DIAGNOSIS — G20 Parkinson's disease: Secondary | ICD-10-CM

## 2017-09-26 MED ORDER — CARBIDOPA-LEVODOPA ER 48.75-195 MG PO CPCR: 585.0000 mg | ORAL_CAPSULE | Freq: Four times a day (QID) | ORAL | 2 refills | Status: AC

## 2017-09-26 NOTE — Telephone Encounter (Addendum)
I called pt. He reports that the sometimes takes his rytary more than prescribed, since he works 16-18 hour days, and that is why he has run out of his rytary about 20 days early. He will take it 5 times daily on those days, exceeding the prescribed amount of 3 pills four times daily.  He also reported an episode of left side numbness yesterday that last less than an hour. Pt considered going to the hospital but the numbness resolved.  Pt wants to know if he should be seen earlier than 11/05/17. Currently, there are no sooner openings than 11/05/17, but I advised pt that I could watch for cancellations.  Pt wants to know if Dr. Frances FurbishAthar will refill his rytary early, and her thoughts on the numbness and if she thinks it is related to him not taking his rytary in 2 days.

## 2017-09-26 NOTE — Telephone Encounter (Signed)
Please advise patient, that I do not recommend that he increase his medication on his own. I do not feel comfortable with him self dosing or adjusting his Rytary, furthermore, I do not feel comfortable maintaining neurologic care long-distance like this. He is advised to seek consultation and establish care with a local neurologist in [place] or [place] area, whichever works better for him. It is in his own best interest, that he see a specialist closer to home. I don't have a license to practice in [place] or [place].  He is advised to talk to his primary care physician about a referral to a neurologist in the area where he lives or works, whichever is more convenient. We have talked about it before. I would be willing to bridge his prescription for Rytary for the next up to 3 months, in order to give him some time to establish care with another neurologist. I will make an exception by allowing an earlier refill at this time, only at the dose previously prescribed, however.

## 2017-09-26 NOTE — Telephone Encounter (Signed)
I called pt and explained Dr. Teofilo PodAthar's recommendations to him. Pt is agreeable to seeing a neurologist in [place]. I explained that pt should not take more than the 3 pills QID of rytary and that a 3 month supply was [facility] [place]. Pt will need to find a local neurologist in the meantime, the 3 month supply of rytary is a bridge supply. Pt asked that the June appt be cancelled with Dr. Jonita AlbeeAthra. Pt verbalized understanding of the recommendations and to not self dose or adjust the rytary beyond what the prescription says. Pt had no questions at this time but was encouraged to call back if questions arise.

## 2017-09-26 NOTE — Telephone Encounter (Signed)
See telephone note from 09/26/17. 

## 2017-09-26 NOTE — Telephone Encounter (Signed)
Pt said yesterday he felt numbness in the left leg, thought it was going to give away, then he got numb all over the left side of his body from head to toe. Pt has been out of Carbidopa-Levodopa ER (RYTARY) 48.75-195 MG CPCR for the past 2 days. Please call to advise

## 2017-09-26 NOTE — Telephone Encounter (Signed)
Pt called said he has been out of Carbidopa-Levodopa ER (RYTARY) 48.75-195 MG CPCR for the past 2 days. It looks like  he got it in November and has 5 refills he is due this month. Please call to advise

## 2017-11-05 ENCOUNTER — Ambulatory Visit: Payer: Self-pay | Admitting: Neurology

## 2018-01-12 IMAGING — CR DG RIBS W/ CHEST 3+V*R*
3 series · 3 of 3 positions shown · non-contrast
Comparison: None.

CLINICAL DATA: Right anterior lower rib pain for 2 months

EXAM:
RIGHT RIBS AND CHEST - 3+ VIEW

[w chest pa]
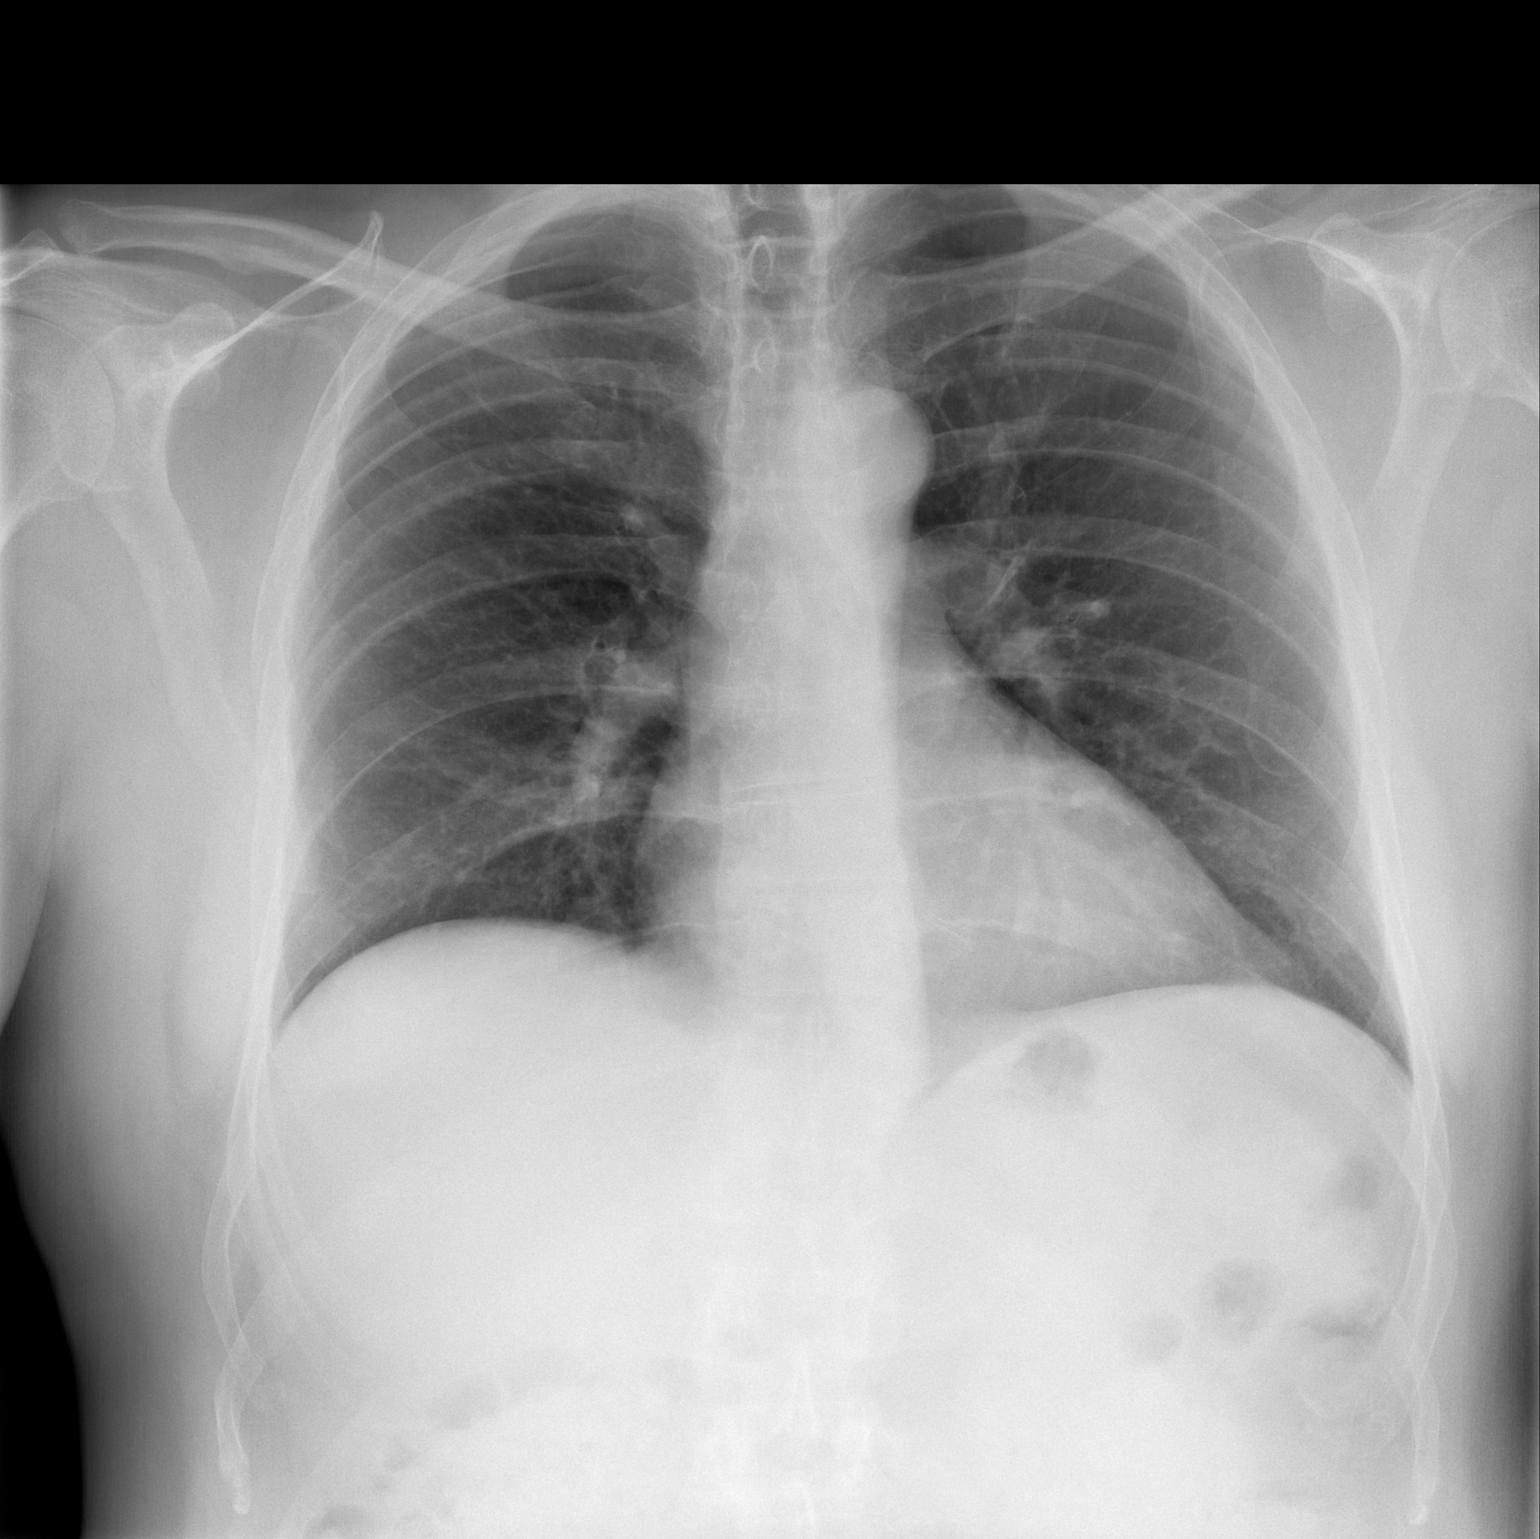

[w ribs ap/pa lower right *]
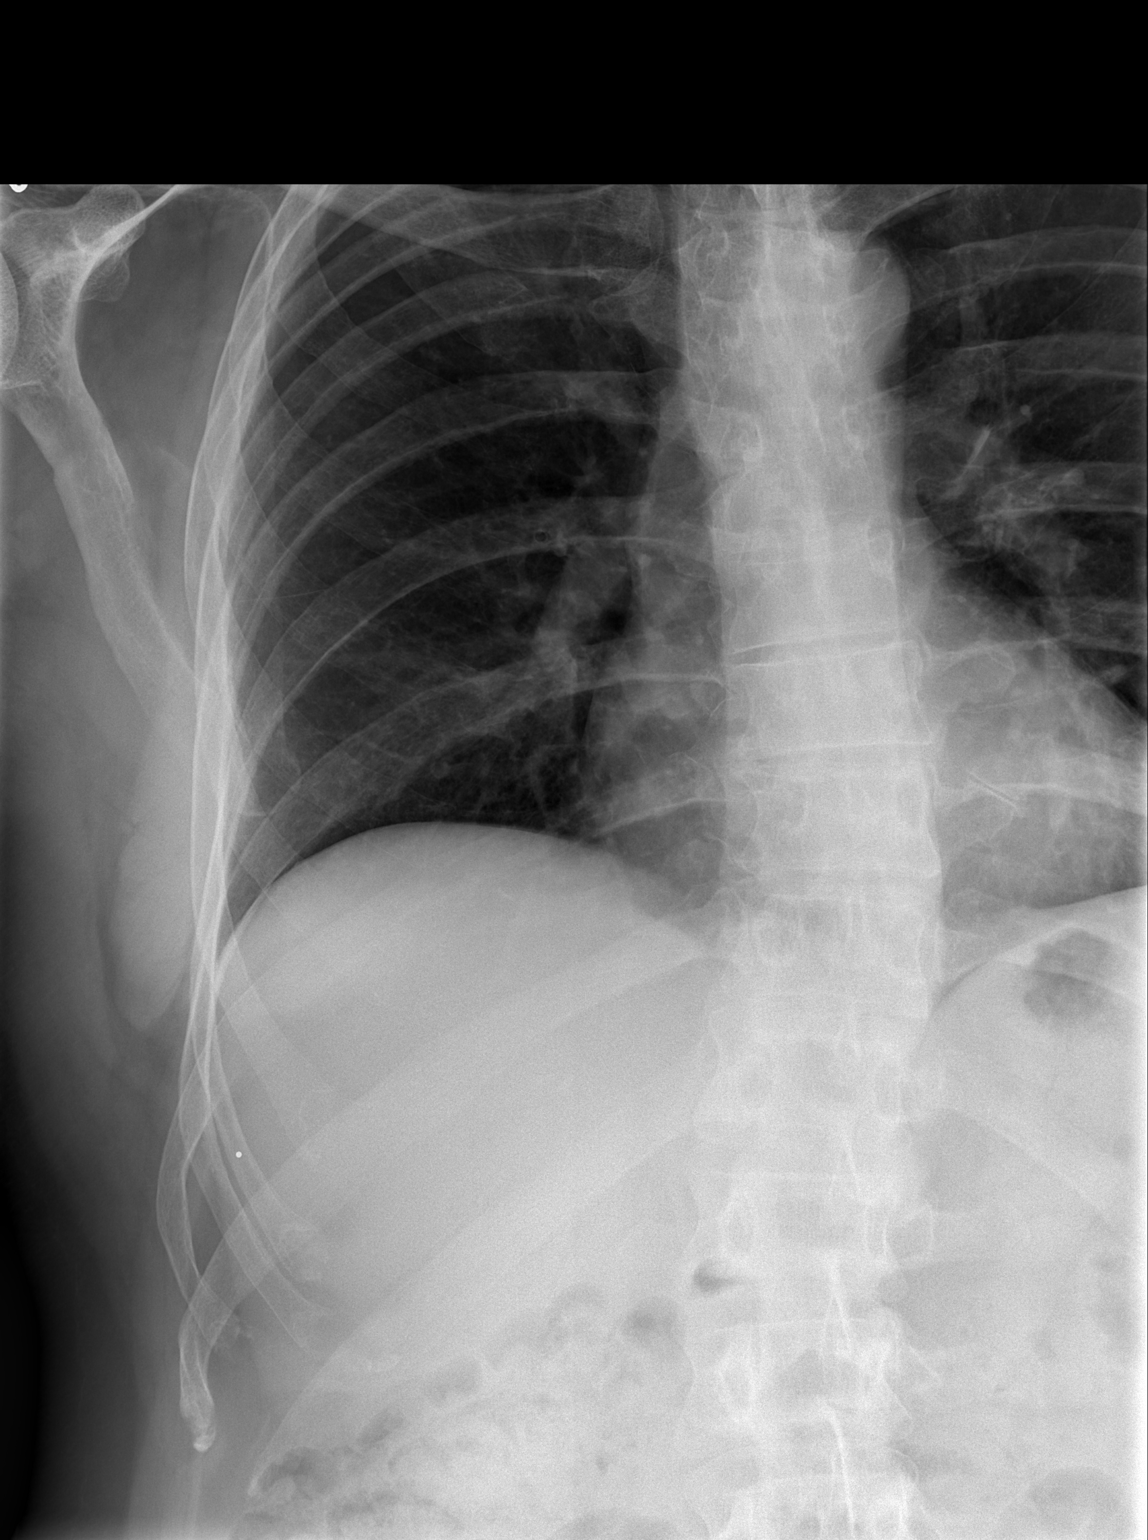

[w ribs oblique right *]
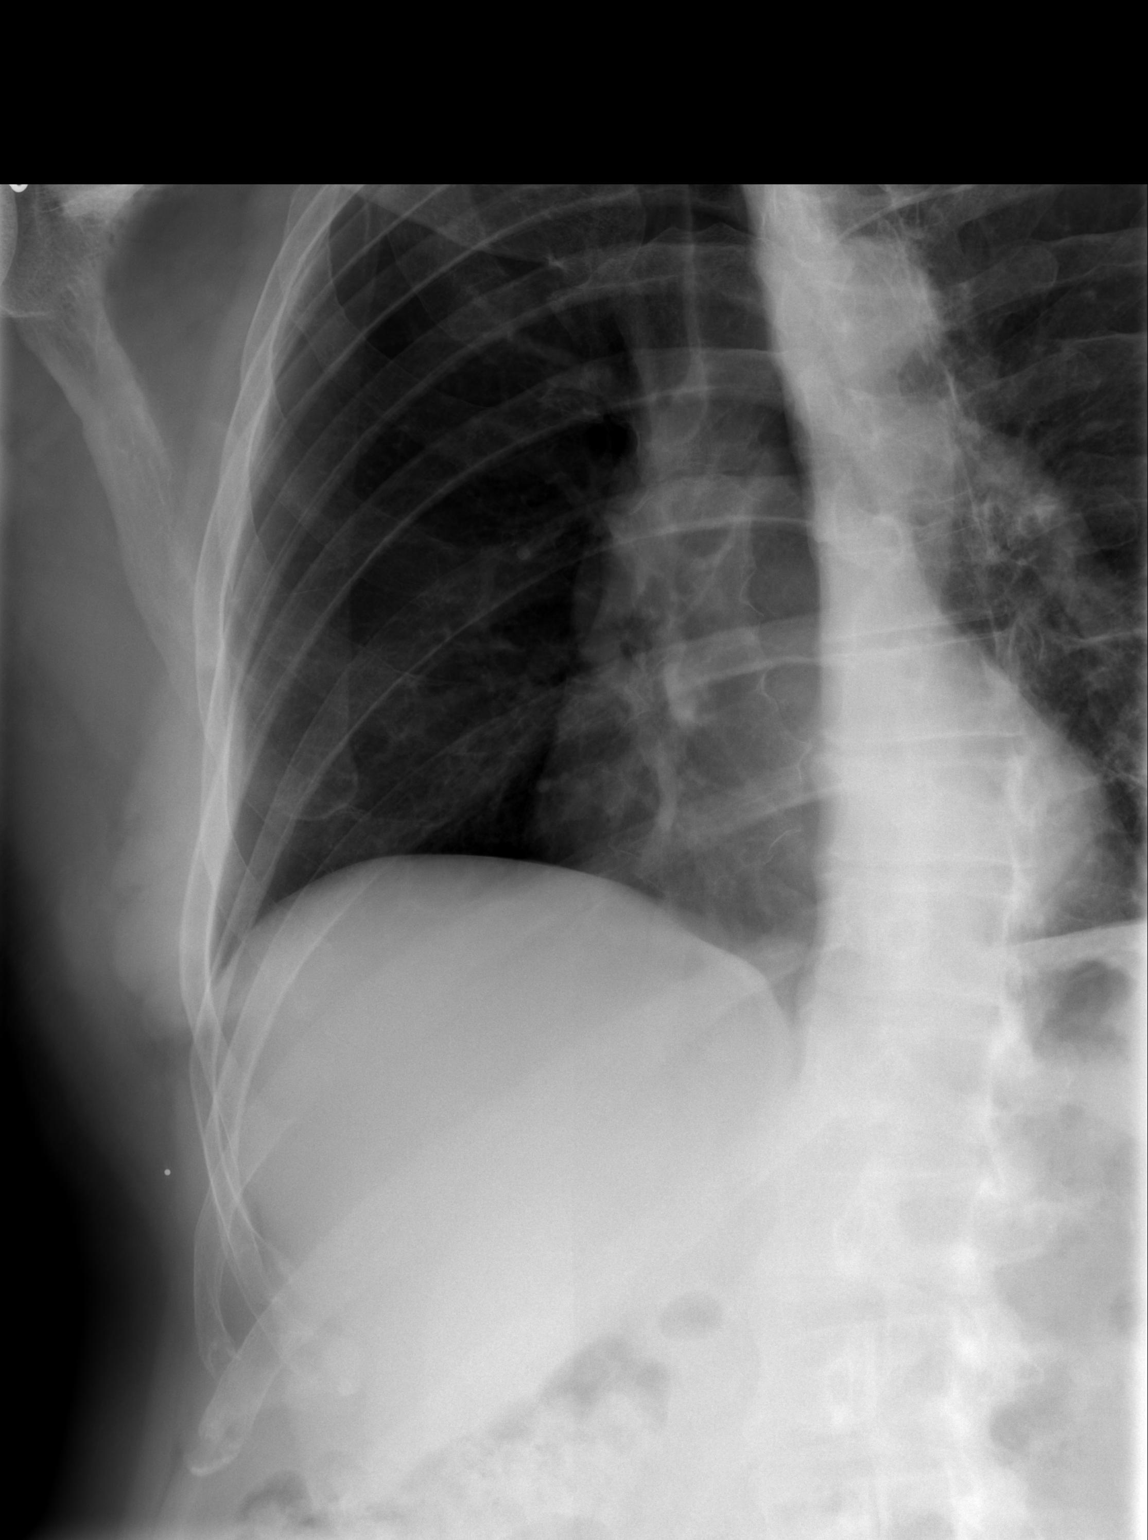

[3 of 3 positions shown; findings below may reference images not displayed]

FINDINGS: No fracture or other bone lesions are seen involving the ribs. There
is no evidence of pneumothorax or pleural effusion. Both lungs are
clear. Heart size and mediastinal contours are within normal limits.
IMPRESSION: 1. No acute osseous injury of the right ribs.
2. No active cardiopulmonary disease.

## 2018-05-01 IMAGING — US US ABDOMEN LIMITED
1 series · 14 of 25 positions shown · non-contrast
Comparison: None.

CLINICAL DATA: Intermittent right upper quadrant pain for 3 months.

EXAM:
US ABDOMEN LIMITED - RIGHT UPPER QUADRANT

[Series 1: us abdomen limited · 0.18mm/px · 14 of 39 slices shown]
[im 1/39]
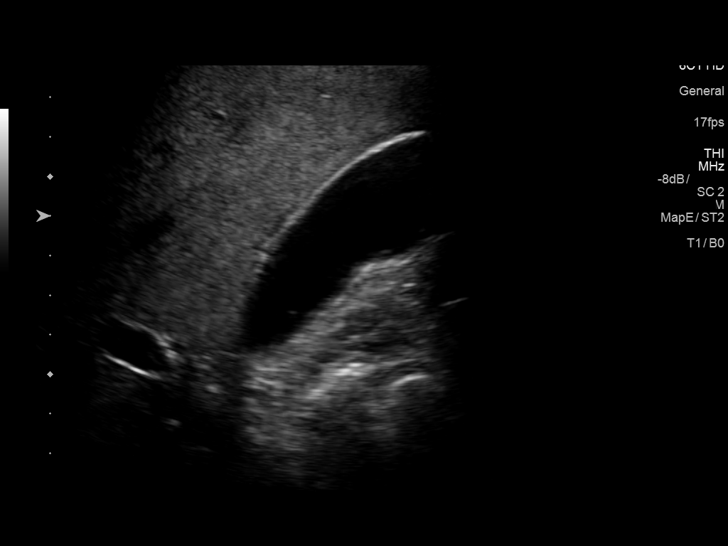
[im 4/39]
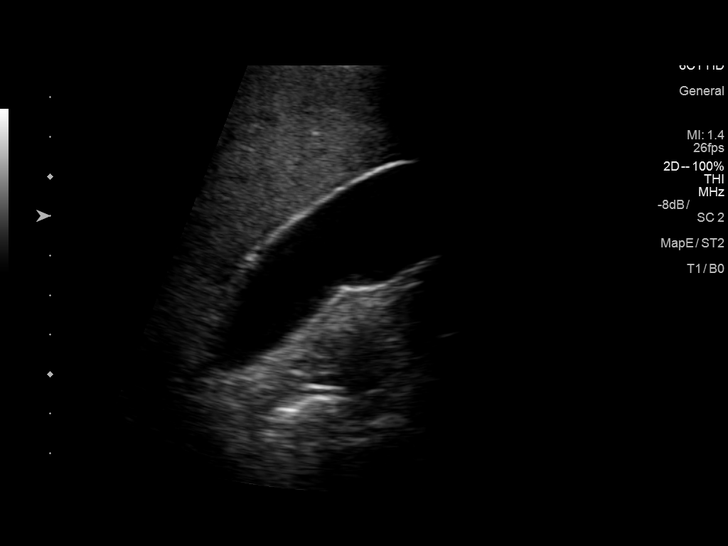
[im 7/39]
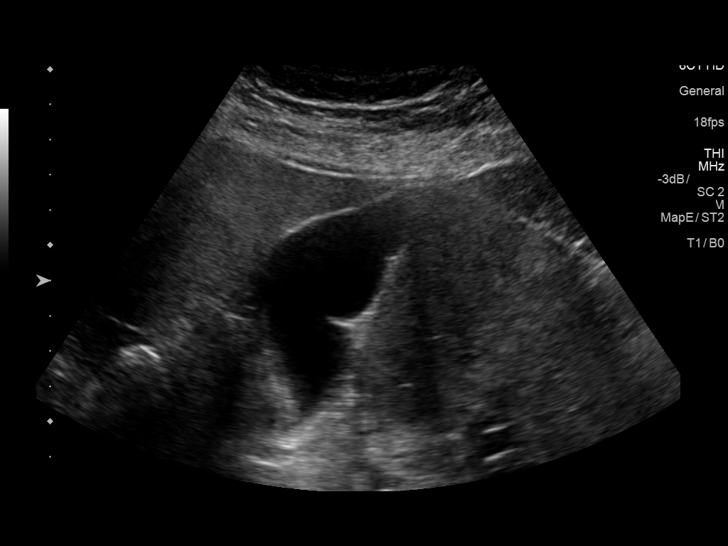
[im 10/39]
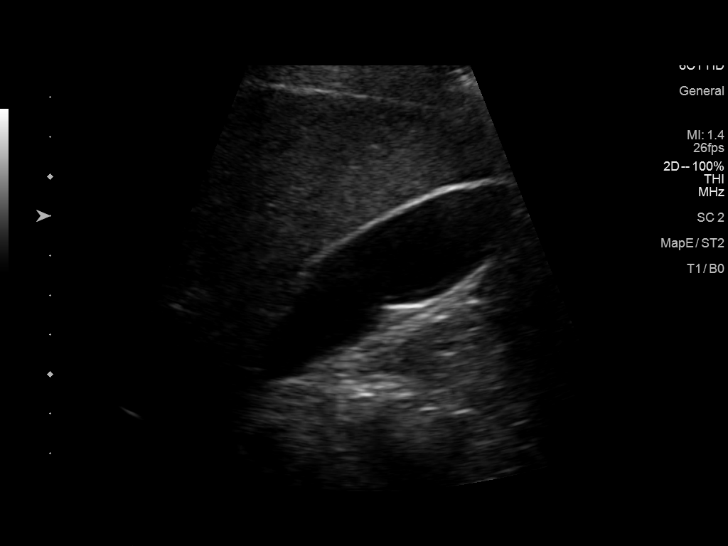
[im 13/39]
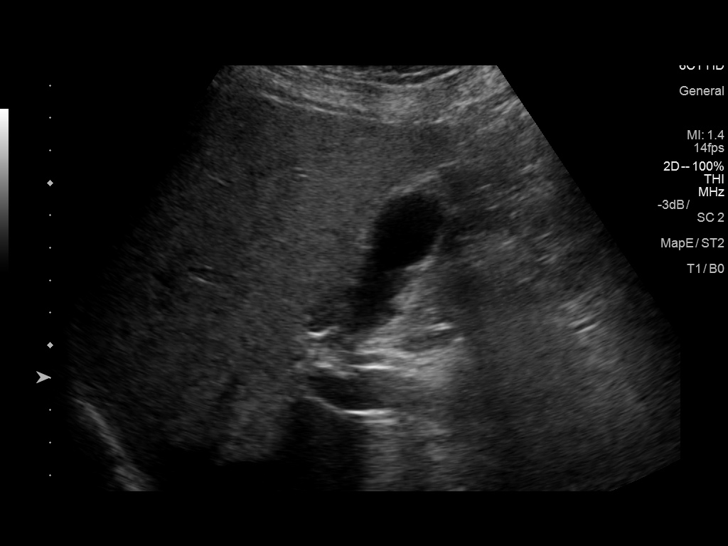
[im 15/39]
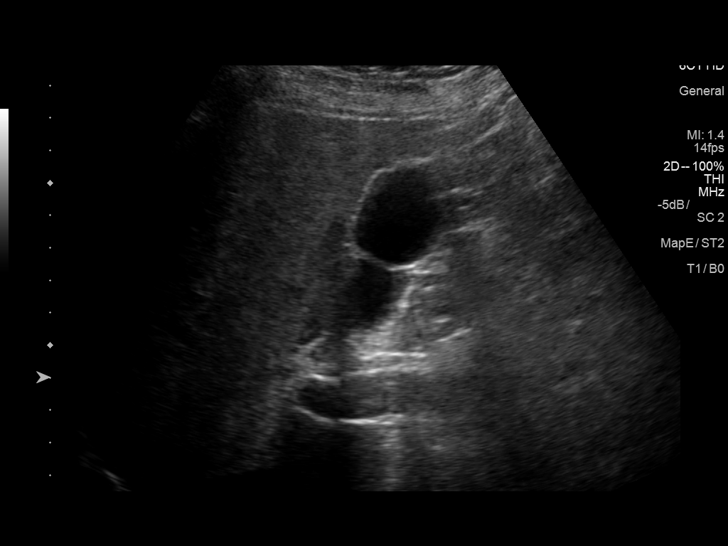
[im 18/39]
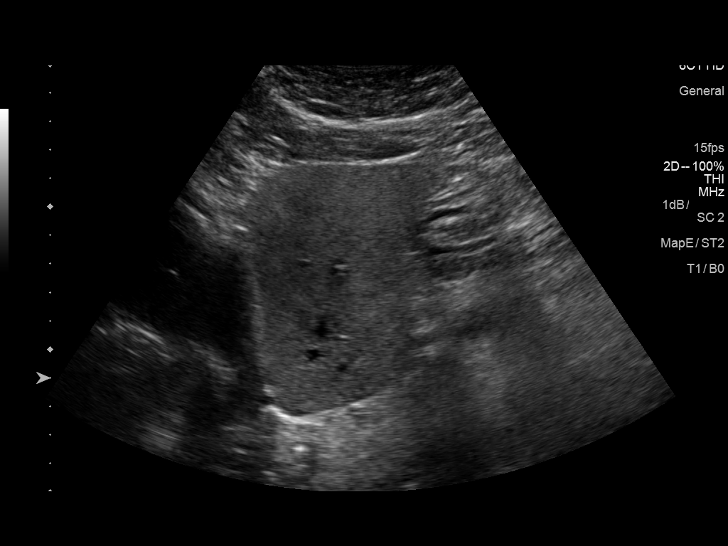
[im 21/39]
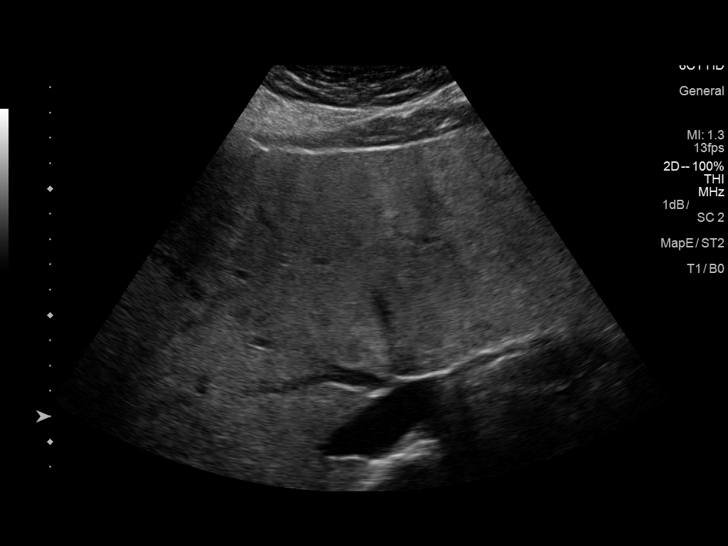
[im 24/39]
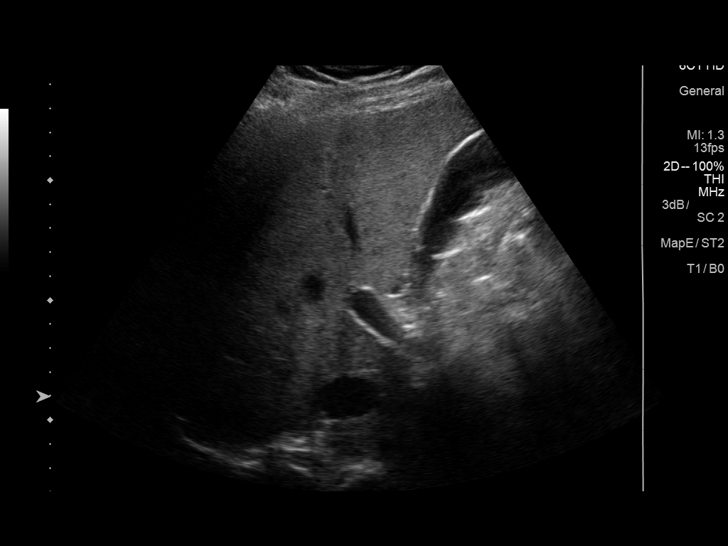
[im 26/39]
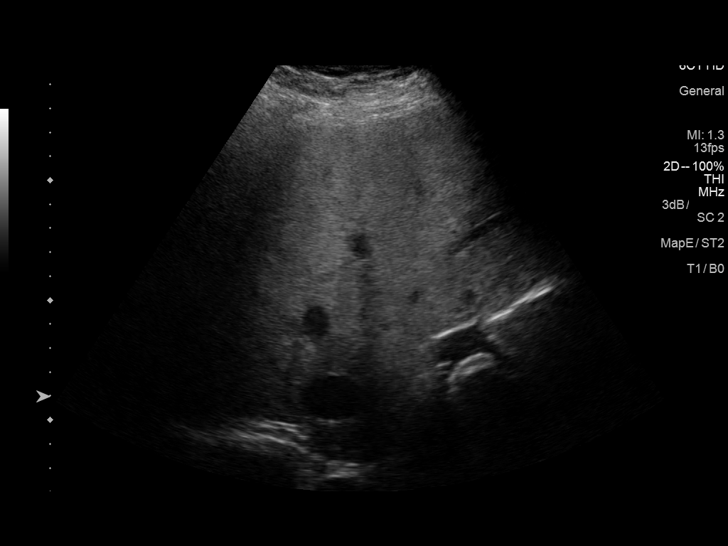
[im 29/39]
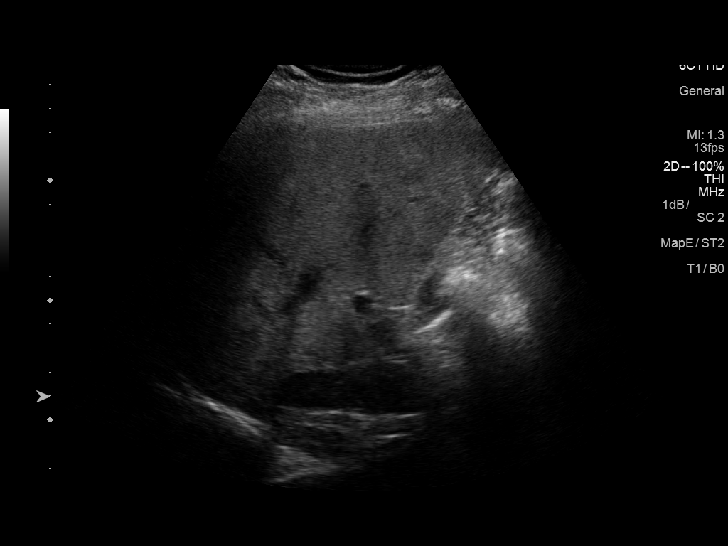
[im 32/39]
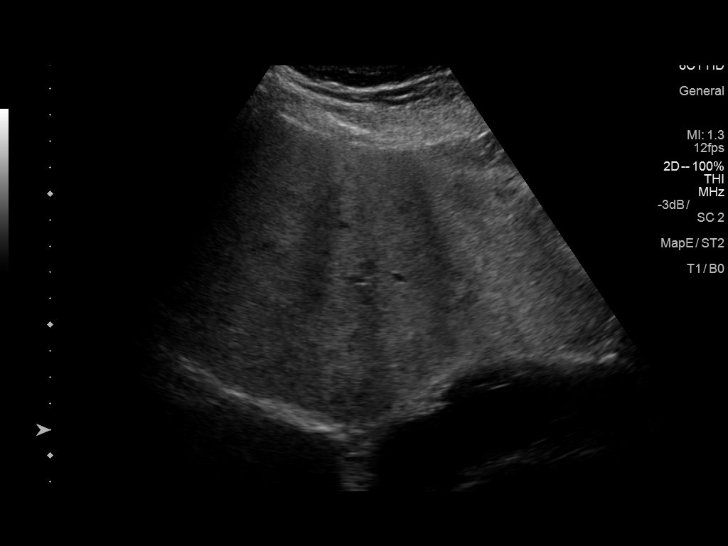
[im 35/39]
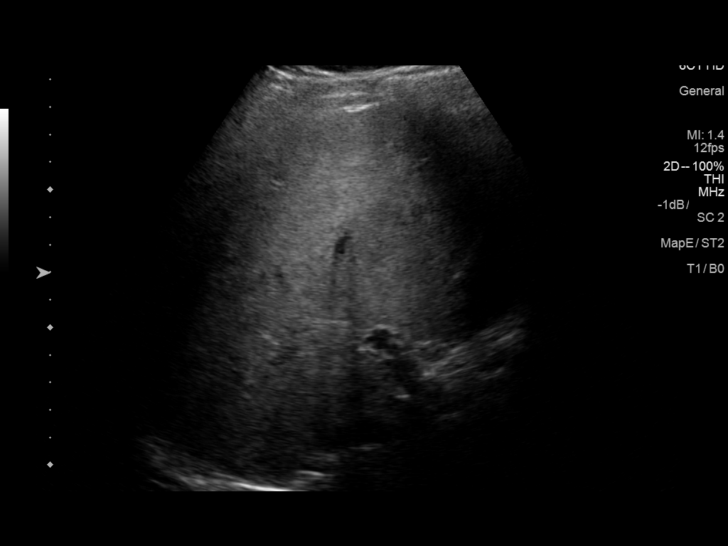
[im 39/39]
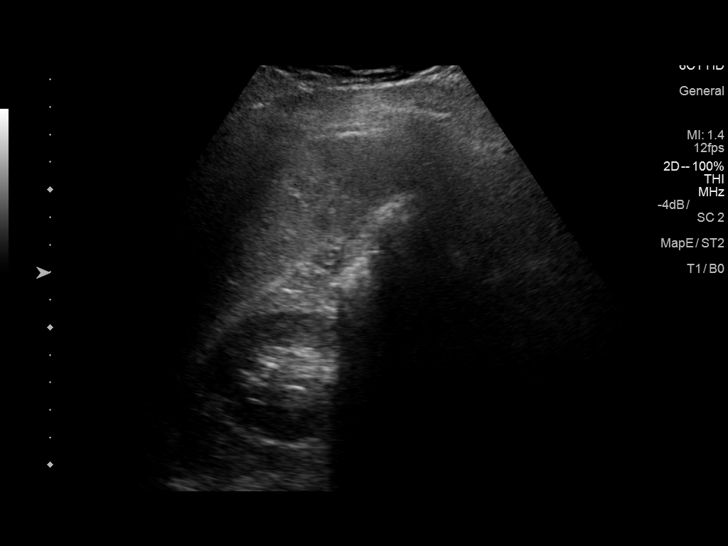

[14 of 25 positions shown; findings below may reference images not displayed]

FINDINGS: Gallbladder:

No gallstones or wall thickening visualized. No sonographic Murphy
sign noted by sonographer.

Common bile duct:

Diameter: 3.1 mm, normal.

Liver:

No focal lesion identified. Within normal limits in parenchymal
echogenicity.
IMPRESSION: Normal exam.
